# Patient Record
Sex: Female | Born: 1980 | Race: Black or African American | Hispanic: No | Marital: Single | Smoking: Never smoker
Health system: Southern US, Community
[De-identification: ages and names within clinical notes are randomized; demographics above are authoritative.]

## PROBLEM LIST (undated history)

## (undated) DIAGNOSIS — Z1379 Encounter for other screening for genetic and chromosomal anomalies: Secondary | ICD-10-CM

## (undated) DIAGNOSIS — K219 Gastro-esophageal reflux disease without esophagitis: Secondary | ICD-10-CM

## (undated) DIAGNOSIS — R519 Headache, unspecified: Secondary | ICD-10-CM

## (undated) DIAGNOSIS — N63 Unspecified lump in unspecified breast: Secondary | ICD-10-CM

## (undated) DIAGNOSIS — R51 Headache: Secondary | ICD-10-CM

## (undated) DIAGNOSIS — T7840XA Allergy, unspecified, initial encounter: Secondary | ICD-10-CM

## (undated) DIAGNOSIS — C50912 Malignant neoplasm of unspecified site of left female breast: Secondary | ICD-10-CM

## (undated) HISTORY — DX: Encounter for other screening for genetic and chromosomal anomalies: Z13.79

## (undated) HISTORY — PX: HYMENPLASTY: SHX1771

## (undated) HISTORY — DX: Allergy, unspecified, initial encounter: T78.40XA

## (undated) HISTORY — PX: HIP SURGERY: SHX245

## (undated) HISTORY — PX: BONY PELVIS SURGERY: SHX572

## (undated) HISTORY — PX: BREAST LUMPECTOMY: SHX2

## (undated) HISTORY — DX: Gastro-esophageal reflux disease without esophagitis: K21.9

---

## 2002-11-25 HISTORY — PX: WISDOM TOOTH EXTRACTION: SHX21

## 2012-04-10 DIAGNOSIS — M5430 Sciatica, unspecified side: Secondary | ICD-10-CM | POA: Insufficient documentation

## 2014-02-05 ENCOUNTER — Emergency Department: Payer: Self-pay | Admitting: Emergency Medicine

## 2014-02-05 LAB — CBC
HCT: 36.3 % (ref 35.0–47.0)
HGB: 12.3 g/dL (ref 12.0–16.0)
MCH: 29.7 pg (ref 26.0–34.0)
MCHC: 33.9 g/dL (ref 32.0–36.0)
MCV: 88 fL (ref 80–100)
PLATELETS: 252 10*3/uL (ref 150–440)
RBC: 4.14 10*6/uL (ref 3.80–5.20)
RDW: 15.1 % — AB (ref 11.5–14.5)
WBC: 7 10*3/uL (ref 3.6–11.0)

## 2014-02-05 LAB — COMPREHENSIVE METABOLIC PANEL
ALK PHOS: 49 U/L
Albumin: 3.6 g/dL (ref 3.4–5.0)
Anion Gap: 4 — ABNORMAL LOW (ref 7–16)
BILIRUBIN TOTAL: 0.4 mg/dL (ref 0.2–1.0)
BUN: 8 mg/dL (ref 7–18)
CALCIUM: 8.7 mg/dL (ref 8.5–10.1)
Chloride: 106 mmol/L (ref 98–107)
Co2: 27 mmol/L (ref 21–32)
Creatinine: 0.8 mg/dL (ref 0.60–1.30)
EGFR (Non-African Amer.): >60
Glucose: 66 mg/dL (ref 65–99)
OSMOLALITY: 270 (ref 275–301)
Potassium: 3.7 mmol/L (ref 3.5–5.1)
SGOT(AST): 16 U/L (ref 15–37)
SGPT (ALT): 11 U/L — ABNORMAL LOW (ref 12–78)
Sodium: 137 mmol/L (ref 136–145)
Total Protein: 7.7 g/dL (ref 6.4–8.2)

## 2014-02-05 LAB — HCG, QUANTITATIVE, PREGNANCY: Beta Hcg, Quant.: 25526 m[IU]/mL — ABNORMAL HIGH

## 2015-02-08 ENCOUNTER — Emergency Department: Payer: Self-pay | Admitting: Emergency Medicine

## 2015-11-29 ENCOUNTER — Emergency Department
Admission: EM | Admit: 2015-11-29 | Discharge: 2015-11-29 | Disposition: A | Discharge status: Home / Self Care | Payer: BLUE CROSS/BLUE SHIELD | Attending: Emergency Medicine | Admitting: Emergency Medicine

## 2015-11-29 ENCOUNTER — Encounter: Payer: Self-pay | Admitting: Emergency Medicine

## 2015-11-29 DIAGNOSIS — J029 Acute pharyngitis, unspecified: Secondary | ICD-10-CM | POA: Diagnosis present

## 2015-11-29 DIAGNOSIS — R112 Nausea with vomiting, unspecified: Secondary | ICD-10-CM | POA: Diagnosis not present

## 2015-11-29 DIAGNOSIS — J020 Streptococcal pharyngitis: Secondary | ICD-10-CM

## 2015-11-29 MED ORDER — LIDOCAINE VISCOUS 2 % MT SOLN
15.0000 mL | Freq: Once | OROMUCOSAL | Status: AC
  Administered 2015-11-29: 15 mL via OROMUCOSAL
  Filled 2015-11-29: qty 15

## 2015-11-29 MED ORDER — DEXAMETHASONE SODIUM PHOSPHATE 10 MG/ML IJ SOLN
10.0000 mg | Freq: Once | INTRAMUSCULAR | Status: AC
  Administered 2015-11-29: 10 mg via INTRAMUSCULAR
  Filled 2015-11-29: qty 1

## 2015-11-29 MED ORDER — PREDNISONE 10 MG PO TABS: 10.0000 mg | ORAL_TABLET | Freq: Two times a day (BID) | ORAL | Status: DC

## 2015-11-29 MED ORDER — PENICILLIN G BENZATHINE 1200000 UNIT/2ML IM SUSP
1.2000 10*6.[IU] | Freq: Once | INTRAMUSCULAR | Status: AC
  Administered 2015-11-29: 1.2 10*6.[IU] via INTRAMUSCULAR
  Filled 2015-11-29: qty 2

## 2015-11-29 NOTE — ED Provider Notes (Signed)
[facility] Emergency Department Provider Note ____________________________________________  Time seen: 0815  I have reviewed the triage vital signs and the nursing notes.  HISTORY  Chief Complaint  Fever and Sore Throat  HPI Kim Barker is a 35 y.o. female presents to the ED for evaluation of continued and worsening sore throat pain.She was evaluated on Sunday, 3 days prior to arrival, for sore throat symptoms at a local urgent care. She was confirmed with Strep via a rapid test. She was provided with 2 IM injections, which she is unclear what they were. She was also discharged with a prescription for Amoxil 500 mg with directions to dose 2 tabs once daily for 10 days. Reports today with increased pain, swelling, difficulty swallowing primary to the right side of the throat. She is also noted some nausea and vomited once today. She rates her overall discomfort at 9/10 in triage.  History reviewed. No pertinent past medical history.  There are no active problems to display for this patient.  History reviewed. No pertinent past surgical history.  Current Outpatient Rx  Name  Route  Sig  Dispense  Refill  . predniSONE (DELTASONE) 10 MG tablet   Oral   Take 1 tablet (10 mg total) by mouth 2 (two) times daily with a meal.   10 tablet   0    Allergies Review of patient's allergies indicates no known allergies.  No family history on file.  Social History Social History  Substance Use Topics  . Smoking status: Never Smoker   . Smokeless tobacco: None  . Alcohol Use: Yes    Review of Systems  Constitutional: Negative for fever. Eyes: Negative for visual changes. ENT: Positive for sore throat. Cardiovascular: Negative for chest pain. Respiratory: Negative for shortness of breath. Gastrointestinal: Negative for abdominal pain, vomiting and diarrhea. Genitourinary: Negative for dysuria. Musculoskeletal: Negative for back pain. Skin: Negative for  rash. Neurological: Negative for headaches, focal weakness or numbness. ____________________________________________  PHYSICAL EXAM:  VITAL SIGNS: ED Triage Vitals  Enc Vitals Group     BP 11/29/15 0802 135/90 mmHg     Pulse Rate 11/29/15 0802 106     Resp 11/29/15 0802 20     Temp 11/29/15 0802 99.2 F (37.3 C)     Temp Source 11/29/15 0802 Oral     SpO2 11/29/15 0802 99 %     Weight 11/29/15 0802 190 lb (86.183 kg)     Height 11/29/15 0802 5\' 4"  (1.626 m)     Head Cir --      Peak Flow --      Pain Score 11/29/15 0802 9     Pain Loc --      Pain Edu? --      Excl. in [place]? --    Constitutional: Alert and oriented. Well appearing and in no distress. Head: Normocephalic and atraumatic.      Eyes: Conjunctivae are normal. PERRL. Normal extraocular movements      Ears: Canals clear. TMs intact bilaterally.   Nose: No congestion/rhinorrhea.   Mouth/Throat: Mucous membranes are moist. Uvula is midline and oropharynx is severely injected. She is noted to have tonsillar fullness and swelling right greater than left., Exudate is noted bilaterally.   Neck: Supple. No thyromegaly. Hematological/Lymphatic/Immunological: Palpable right anterior cervical lymphadenopathy. Cardiovascular: Normal rate, regular rhythm.  Respiratory: Normal respiratory effort. No wheezes/rales/rhonchi. Gastrointestinal: Soft and nontender. No distention. Musculoskeletal: Nontender with normal range of motion in all extremities.  Neurologic:  Normal gait without  ataxia. Normal speech and language. No gross focal neurologic deficits are appreciated. Skin:  Skin is warm, dry and intact. No rash noted. Psychiatric: Mood and affect are normal. Patient exhibits appropriate insight and judgment. ____________________________________________   LABS (pertinent positives/negatives) Labs Reviewed  CULTURE, GROUP A STREP ([facility] ONLY)  ____________________________________________  PROCEDURES  Penicillin G 1.2  million units Decadron 10 mg PO Viscous lido 2% gargle ____________________________________________  INITIAL IMPRESSION / ASSESSMENT AND PLAN / ED COURSE  Patient with strep pharyngitis which is worsening secondary to underdosing with the appropriate antibiotic. Patient will be treated empirically with pen G injection, and discharged with Decadron dose for tonsillar swelling. She is encouraged to monitor symptoms and return to the ED immediately for worsening pain, swelling, difficulty swallowing. ____________________________________________  FINAL CLINICAL IMPRESSION(S) / ED DIAGNOSES  Final diagnoses:  Strep pharyngitis      Melvenia Needles, PA-C 11/29/15 [id number]  Harvest Dark, MD 11/29/15 1513

## 2015-11-29 NOTE — Discharge Instructions (Signed)
Strep Throat Strep throat is a bacterial infection of the throat. Your health care provider may call the infection tonsillitis or pharyngitis, depending on whether there is swelling in the tonsils or at the back of the throat. Strep throat is most common during the cold months of the year in children who are 39-33 years of age, but it can happen during any season in people of any age. This infection is spread from person to person (contagious) through coughing, sneezing, or close contact. CAUSES Strep throat is caused by the bacteria called Streptococcus pyogenes. RISK FACTORS This condition is more likely to develop in:  People who spend time in crowded places where the infection can spread easily.  People who have close contact with someone who has strep throat. SYMPTOMS Symptoms of this condition include:  Fever or chills.   Redness, swelling, or pain in the tonsils or throat.  Pain or difficulty when swallowing.  White or yellow spots on the tonsils or throat.  Swollen, tender glands in the neck or under the jaw.  Red rash all over the body (rare). DIAGNOSIS This condition is diagnosed by performing a rapid strep test or by taking a swab of your throat (throat culture test). Results from a rapid strep test are usually ready in a few minutes, but throat culture test results are available after one or two days. TREATMENT This condition is treated with antibiotic medicine. HOME CARE INSTRUCTIONS Medicines  Take over-the-counter and prescription medicines only as told by your health care provider.  Take your antibiotic as told by your health care provider. Do not stop taking the antibiotic even if you start to feel better.  Have family members who also have a sore throat or fever tested for strep throat. They may need antibiotics if they have the strep infection. Eating and Drinking  Do not share food, drinking cups, or personal items that could cause the infection to spread to  other people.  If swallowing is difficult, try eating soft foods until your sore throat feels better.  Drink enough fluid to keep your urine clear or pale yellow. General Instructions  Gargle with a salt-water mixture 3-4 times per day or as needed. To make a salt-water mixture, completely dissolve -1 tsp of salt in 1 cup of warm water.  Make sure that all household members wash their hands well.  Get plenty of rest.  Stay home from school or work until you have been taking antibiotics for 24 hours.  Keep all follow-up visits as told by your health care provider. This is important. SEEK MEDICAL CARE IF:  The glands in your neck continue to get bigger.  You develop a rash, cough, or earache.  You cough up a thick liquid that is green, yellow-brown, or bloody.  You have pain or discomfort that does not get better with medicine.  Your problems seem to be getting worse rather than better.  You have a fever. SEEK IMMEDIATE MEDICAL CARE IF:  You have new symptoms, such as vomiting, severe headache, stiff or painful neck, chest pain, or shortness of breath.  You have severe throat pain, drooling, or changes in your voice.  You have swelling of the neck, or the skin on the neck becomes red and tender.  You have signs of dehydration, such as fatigue, dry mouth, and decreased urination.  You become increasingly sleepy, or you cannot wake up completely.  Your joints become red or painful.   This information is not intended to replace  advice given to you by your health care provider. Make sure you discuss any questions you have with your health care provider.   Document Released: 11/08/2000 Document Revised: 08/02/2015 Document Reviewed: 03/06/2015 Elsevier Interactive Patient Education 2016 [place] the prescription steroid as directed. Continue Tylenol as needed. Rinse with warm-salty water daily.  Consider mixing equal parts of Benadryl elixir + Maalox/Mylanta and  gargling as needed. Return to the ED for worsening symptoms or difficulty swallowing.

## 2015-11-29 NOTE — ED Notes (Addendum)
Pt reports she was diagnosed on Sunday at Urgent Care with strep throat and was given 2 shots. Pt states she is not feeling any better. Pt states she has been taking Ibuprofen and Tylenol as well. Prescribed Amoxicillin Sunday 2 pills daily. Did not take today due to vomiting.

## 2015-11-29 NOTE — ED Notes (Signed)
Pt dx with strep on Sunday was started on amoxicillin, pt states she  Not any better.

## 2015-12-01 LAB — CULTURE, GROUP A STREP (THRC)

## 2016-03-21 ENCOUNTER — Encounter: Payer: Self-pay | Admitting: General Surgery

## 2016-03-21 ENCOUNTER — Ambulatory Visit (INDEPENDENT_AMBULATORY_CARE_PROVIDER_SITE_OTHER): Payer: BLUE CROSS/BLUE SHIELD | Admitting: General Surgery

## 2016-03-21 VITALS — BP 129/82 | HR 87 | Temp 98.7°F | Ht 64.0 in | Wt 193.8 lb

## 2016-03-21 DIAGNOSIS — K429 Umbilical hernia without obstruction or gangrene: Secondary | ICD-10-CM | POA: Diagnosis not present

## 2016-03-21 NOTE — Progress Notes (Signed)
Patient ID: Kim Barker, female   DOB: Apr 11, 1981, 35 y.o.   MRN: [id number]  CC: ABDOMINAL PAIN  HPI Kim Barker is a 35 y.o. female presents to clinic for evaluation of a bulge and pain to her anterior abdominal wall. Patient reports that she has known about an umbilical hernia for at least the last 12 years and that every time she is pregnant the area of her umbilicus bulges out. She is also noticed over the last several months an area of bulging and tenderness superior to the umbilicus that bothers her about once a week when she does have a lifting or bends over to lift something. She's never had bulges take out from either site that had not been able to reduce. She's never had to manually reduce either site. She denies any fevers, chills, nausea, vomiting, diarrhea, constipation, chest pain, shortness of breath. She also states that she is thinking about having liposuction and wants to make sure that neither of these areas would preclude her from liposuction. She is otherwise in her usual state of excellent health.  HPI  Past Medical History  Diagnosis Date  . GERD (gastroesophageal reflux disease)   . Allergy     Past Surgical History  Procedure Laterality Date  . Hymenplasty  35 years old  . Wisdom tooth extraction  2004  . Breast lumpectomy Left 35 years old    Benign    Family History  Problem Relation Age of Onset  . Diabetes Mother   . Asthma Father   . Diabetes Father   . Asthma Brother   . Diabetes Maternal Grandmother   . COPD Paternal Grandfather   . Heart disease Paternal Grandfather     Social History Social History  Substance Use Topics  . Smoking status: Never Smoker   . Smokeless tobacco: Never Used  . Alcohol Use: Yes    No Known Allergies  Current Outpatient Prescriptions  Medication Sig Dispense Refill  . acetaminophen (TYLENOL) 325 MG tablet Take 650 mg by mouth every 6 (six) hours as needed.    Marland Kitchen ibuprofen (ADVIL,MOTRIN) 200 MG tablet Take 400  mg by mouth every 6 (six) hours as needed.     No current facility-administered medications for this visit.     Review of Systems A Multi-point review of systems was asked and was negative except for the findings documented in the history of present illness  Physical Exam Blood pressure 129/82, pulse 87, temperature 98.7 F (37.1 C), temperature source Oral, height 5\' 4"  (1.626 m), weight 87.907 kg (193 lb 12.8 oz), last menstrual period 03/03/2016. CONSTITUTIONAL: No acute distress. EYES: Pupils are equal, round, and reactive to light, Sclera are non-icteric. EARS, NOSE, MOUTH AND THROAT: The oropharynx is clear. The oral mucosa is pink and moist. Hearing is intact to voice. LYMPH NODES:  Lymph nodes in the neck are normal. RESPIRATORY:  Lungs are clear. There is normal respiratory effort, with equal breath sounds bilaterally, and without pathologic use of accessory muscles. CARDIOVASCULAR: Heart is regular without murmurs, gallops, or rubs. GI: The abdomen is soft, and nondistended. There is a visible umbilical hernia. It is easily reducible and nontender. The area cephalad to this has a defect that may represent a diastases recti versus ventral hernia. It is mildly tender to deep palpation just to the right of midline. There is no obvious hernia sac on examination.. There is no hepatosplenomegaly. There are normal bowel sounds in all quadrants. GU: Rectal deferred.   MUSCULOSKELETAL: Normal muscle  strength and tone. No cyanosis or edema.   SKIN: Turgor is good and there are no pathologic skin lesions or ulcers. NEUROLOGIC: Motor and sensation is grossly normal. Cranial nerves are grossly intact. PSYCH:  Oriented to person, place and time. Affect is normal.  Data Reviewed There are no labs or images to be reviewed for this appointment I have personally reviewed the patient's imaging, laboratory findings and medical records.    Assessment    35 year old female with an umbilical hernia  and possible ventral hernia    Plan    Had a long conversation with the patient about the diagnosis of umbilical hernia and possible ventral hernia. Also discussed that this may simply reflect a diastases recti. The differential between hernia and diastases were discussed at length as well as the possible treatment options. Discussed that I did not think that either these problems would prevent her from having a lipoma suction. However stated that if she does have ventral hernias they will not improve 5 liposuction and would eventually require surgical correction. At present, she states they do not bother her enough to want to pursue a potential painful surgery. Provided her with our contact information and the understanding that should she change her mind and become ready for surgery that she can call the clinic and cross-sectional imaging would be obtained to delineate whether not this is a diastases or a ventral hernia. The signs and symptoms of incarceration strength motion were discussed in detail patient voiced understanding. She will follow up emergently should either that occur. Otherwise, she'll follow-up in clinic on an as-needed basis.     Time spent with the patient was 30 minutes, with more than 50% of the time spent in face-to-face education, counseling and care coordination.     Clayburn Pert, MD FACS General Surgeon 03/21/2016, 10:35 AM

## 2016-03-21 NOTE — Patient Instructions (Signed)
Call our office or go to the Emergency Room if you develop any of the symptoms below causing emergency concern.  If this becomes more of a problem for you and you would like to have surgery done, please call our office. We will get an Ultrasound first and then have you come back to see Dr. Adonis Huguenin after the Ultrasound.    Hernia, Adult A hernia is the bulging of an organ or tissue through a weak spot in the muscles of the abdomen (abdominal wall). Hernias develop most often near the navel or groin. There are many kinds of hernias. Common kinds include:  Femoral hernia. This kind of hernia develops under the groin in the upper thigh area.  Inguinal hernia. This kind of hernia develops in the groin or scrotum.  Umbilical hernia. This kind of hernia develops near the navel.  Hiatal hernia. This kind of hernia causes part of the stomach to be pushed up into the chest.  Incisional hernia. This kind of hernia bulges through a scar from an abdominal surgery. CAUSES This condition may be caused by:  Heavy lifting.  Coughing over a long period of time.  Straining to have a bowel movement.  An incision made during an abdominal surgery.  A birth defect (congenital defect).  Excess weight or obesity.  Smoking.  Poor nutrition.  Cystic fibrosis.  Excess fluid in the abdomen.  Undescended testicles. SYMPTOMS Symptoms of a hernia include:  A lump on the abdomen. This is the first sign of a hernia. The lump may become more obvious with standing, straining, or coughing. It may get bigger over time if it is not treated or if the condition causing it is not treated.  Pain. A hernia is usually painless, but it may become painful over time if treatment is delayed. The pain is usually dull and may get worse with standing or lifting heavy objects. Sometimes a hernia gets tightly squeezed in the weak spot (strangulated) or stuck there (incarcerated) and causes additional symptoms. These  symptoms may include:  Vomiting.  Nausea.  Constipation.  Irritability. DIAGNOSIS A hernia may be diagnosed with:  A physical exam. During the exam your health care provider may ask you to cough or to make a specific movement, because a hernia is usually more visible when you move.  Imaging tests. These can include:  X-rays.  Ultrasound.  CT scan. TREATMENT A hernia that is small and painless may not need to be treated. A hernia that is large or painful may be treated with surgery. Inguinal hernias may be treated with surgery to prevent incarceration or strangulation. Strangulated hernias are always treated with surgery, because lack of blood to the trapped organ or tissue can cause it to die. Surgery to treat a hernia involves pushing the bulge back into place and repairing the weak part of the abdomen. HOME CARE INSTRUCTIONS  Avoid straining.  Do not lift anything heavier than 10 lb (4.5 kg).  Lift with your leg muscles, not your back muscles. This helps avoid strain.  When coughing, try to cough gently.  Prevent constipation. Constipation leads to straining with bowel movements, which can make a hernia worse or cause a hernia repair to break down. You can prevent constipation by:  Eating a high-fiber diet that includes plenty of fruits and vegetables.  Drinking enough fluids to keep your urine clear or pale yellow. Aim to drink 6-8 glasses of water per day.  Using a stool softener as directed by your health care  provider.  Lose weight, if you are overweight.  Do not use any tobacco products, including cigarettes, chewing tobacco, or electronic cigarettes. If you need help quitting, ask your health care provider.  Keep all follow-up visits as directed by your health care provider. This is important. Your health care provider may need to monitor your condition. SEEK MEDICAL CARE IF:  You have swelling, redness, and pain in the affected area.  Your bowel habits  change. SEEK IMMEDIATE MEDICAL CARE IF:  You have a fever.  You have abdominal pain that is getting worse.  You feel nauseous or you vomit.  You cannot push the hernia back in place by gently pressing on it while you are lying down.  The hernia:  Changes in shape or size.  Is stuck outside the abdomen.  Becomes discolored.  Feels hard or tender.   This information is not intended to replace advice given to you by your health care provider. Make sure you discuss any questions you have with your health care provider.   Document Released: 11/11/2005 Document Revised: 12/02/2014 Document Reviewed: 09/21/2014 Elsevier Interactive Patient Education [employer].

## 2016-11-17 ENCOUNTER — Emergency Department
Admission: EM | Admit: 2016-11-17 | Discharge: 2016-11-17 | Disposition: A | Discharge status: Home / Self Care | Payer: BLUE CROSS/BLUE SHIELD | Attending: Emergency Medicine | Admitting: Emergency Medicine

## 2016-11-17 ENCOUNTER — Emergency Department: Payer: BLUE CROSS/BLUE SHIELD

## 2016-11-17 ENCOUNTER — Encounter: Payer: Self-pay | Admitting: Emergency Medicine

## 2016-11-17 DIAGNOSIS — M79604 Pain in right leg: Secondary | ICD-10-CM

## 2016-11-17 DIAGNOSIS — M79661 Pain in right lower leg: Secondary | ICD-10-CM | POA: Insufficient documentation

## 2016-11-17 MED ORDER — GABAPENTIN 100 MG PO CAPS: 100.0000 mg | ORAL_CAPSULE | Freq: Three times a day (TID) | ORAL | 0 refills | Status: DC

## 2016-11-17 NOTE — ED Triage Notes (Signed)
States pain that goes down the leg x 2 weeks - numbness in foot since last night. Able to ambulate with steady gait

## 2016-11-17 NOTE — ED Provider Notes (Signed)
[facility] Emergency Department Provider Note _____________________________________   I have reviewed the triage vital signs and the nursing notes.   HISTORY  Chief Complaint Leg Pain   History limited by: Not Limited   HPI Kim Barker is a 35 y.o. female who presents to the emergency department today because of concern for possible blood clot. The patient states she has been having right leg pain for the past 2 weeks. This located in the back of her leg. Extends from her knee down to her feet. The patient states that she did not have any trauma to her leg. She denies any unusual use of her leg. She came in today because she started feeling some numbness in her feet. Denies similar symptoms in the past. Is concerned for blood clot because her family member was recently diagnosed with a DVT.   Past Medical History:  Diagnosis Date  . Allergy   . GERD (gastroesophageal reflux disease)     Patient Active Problem List   Diagnosis Date Noted  . Umbilical hernia without obstruction or gangrene 03/21/2016  . 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 BREAST LUMPECTOMY Left 35 years old   Benign  . HYMENPLASTY  35 years old  . [place] EXTRACTION  2004    Prior to Admission medications   Medication Sig Start Date End Date Taking? Authorizing Provider  acetaminophen (TYLENOL) 325 MG tablet Take 650 mg by mouth every 6 (six) hours as needed.    Historical Provider, MD  ibuprofen (ADVIL,MOTRIN) 200 MG tablet Take 400 mg by mouth every 6 (six) hours as needed.    Historical Provider, MD    Allergies Patient has no known allergies.  Family History  Problem Relation Age of Onset  . Diabetes Mother   . Asthma Father   . Diabetes Father   . Asthma Brother   . Diabetes Maternal Grandmother   . COPD Paternal Grandfather   . Heart disease Paternal Grandfather     Social History Social  History  Substance Use Topics  . Smoking status: Never Smoker  . Smokeless tobacco: Never Used  . Alcohol use Yes    Review of Systems  Constitutional: Negative for fever. Cardiovascular: Negative for chest pain. Respiratory: Negative for shortness of breath. Gastrointestinal: Negative for abdominal pain, vomiting and diarrhea. Genitourinary: Negative for dysuria. Musculoskeletal: Positive for right lower leg pain. Skin: Negative for rash. Neurological: Negative for headaches, focal weakness or numbness.  10-point ROS otherwise negative.  ____________________________________________   PHYSICAL EXAM:  VITAL SIGNS: ED Triage Vitals  Enc Vitals Group     BP 11/17/16 1804 139/76     Pulse Rate 11/17/16 1804 92     Resp 11/17/16 1804 18     Temp 11/17/16 1804 98.2 F (36.8 C)     Temp src --      SpO2 11/17/16 1804 100 %     Weight 11/17/16 1804 184 lb (83.5 kg)     Height 11/17/16 1804 5\' 4"  (1.626 m)     Head Circumference --      Peak Flow --      Pain Score 11/17/16 1805 6    Constitutional: Alert and oriented. Well appearing and in no distress. Eyes: Conjunctivae are normal. Normal extraocular movements. ENT   Head: Normocephalic and atraumatic.   Nose: No congestion/rhinnorhea.   Mouth/Throat: Mucous membranes are moist.   Neck: No stridor. Hematological/Lymphatic/Immunilogical: No cervical  lymphadenopathy. Cardiovascular: Normal rate, regular rhythm.  No murmurs, rubs, or gallops.  Respiratory: Normal respiratory effort without tachypnea nor retractions. Breath sounds are clear and equal bilaterally. No wheezes/rales/rhonchi. Musculoskeletal: Normal range of motion in all extremities. No lower extremity edema. No erythema or warmth to right lower leg. Compartments soft. DP 2+ Neurologic:  Normal speech and language. No gross focal neurologic deficits are appreciated.  Skin:  Skin is warm, dry and intact. No rash noted. Psychiatric: Mood and affect  are normal. Speech and behavior are normal. Patient exhibits appropriate insight and judgment.  ____________________________________________    LABS (pertinent positives/negatives)  Labs Reviewed - No data to display   ____________________________________________   EKG  None  ____________________________________________    RADIOLOGY  RLE US IMPRESSION: No evidence of deep venous thrombosis.  ____________________________________________   PROCEDURES  Procedures  ____________________________________________   INITIAL IMPRESSION / ASSESSMENT AND PLAN / ED COURSE  Pertinent labs & imaging results that were available during my care of the patient were reviewed by me and considered in my medical decision making (see chart for details).  Patient presented to the emergency department today with concerns for right lower leg pain and possible blood,. Ultrasound was negative for DVT. Exam no concerning findings. Patient does have a history of sciatica. Will try gabapentin for the patient for her pain.  ____________________________________________   FINAL CLINICAL IMPRESSION(S) / ED DIAGNOSES  Final diagnoses:  Right leg pain     Note: This dictation was prepared with Dragon dictation. Any transcriptional errors that result from this process are unintentional     Nance Pear, MD 11/17/16 2148

## 2016-11-17 NOTE — Discharge Instructions (Signed)
Please seek medical attention for any high fevers, chest pain, shortness of breath, change in behavior, persistent vomiting, bloody stool or any other new or concerning symptoms.  

## 2016-11-17 NOTE — ED Triage Notes (Signed)
Pt still in u/s.  

## 2016-11-17 NOTE — ED Notes (Signed)

## 2016-11-25 DIAGNOSIS — C50912 Malignant neoplasm of unspecified site of left female breast: Secondary | ICD-10-CM

## 2016-11-25 DIAGNOSIS — Z1379 Encounter for other screening for genetic and chromosomal anomalies: Secondary | ICD-10-CM

## 2016-11-25 HISTORY — DX: Encounter for other screening for genetic and chromosomal anomalies: Z13.79

## 2016-11-25 HISTORY — DX: Malignant neoplasm of unspecified site of left female breast: C50.912

## 2017-01-02 ENCOUNTER — Other Ambulatory Visit: Payer: Self-pay | Admitting: Family Medicine

## 2017-01-02 DIAGNOSIS — N6322 Unspecified lump in the left breast, upper inner quadrant: Secondary | ICD-10-CM

## 2017-01-14 ENCOUNTER — Ambulatory Visit
Admission: RE | Admit: 2017-01-14 | Discharge: 2017-01-14 | Disposition: A | Discharge status: Home / Self Care | Payer: BLUE CROSS/BLUE SHIELD | Source: Ambulatory Visit | Attending: Family Medicine | Admitting: Family Medicine

## 2017-01-14 DIAGNOSIS — Z9889 Other specified postprocedural states: Secondary | ICD-10-CM | POA: Diagnosis present

## 2017-01-14 DIAGNOSIS — N6322 Unspecified lump in the left breast, upper inner quadrant: Secondary | ICD-10-CM | POA: Insufficient documentation

## 2017-01-14 DIAGNOSIS — Z803 Family history of malignant neoplasm of breast: Secondary | ICD-10-CM | POA: Insufficient documentation

## 2017-01-14 HISTORY — DX: Unspecified lump in unspecified breast: N63.0

## 2017-01-21 ENCOUNTER — Other Ambulatory Visit: Payer: Self-pay | Admitting: Family Medicine

## 2017-01-21 DIAGNOSIS — R928 Other abnormal and inconclusive findings on diagnostic imaging of breast: Secondary | ICD-10-CM

## 2017-01-21 DIAGNOSIS — N6320 Unspecified lump in the left breast, unspecified quadrant: Secondary | ICD-10-CM

## 2017-01-27 ENCOUNTER — Ambulatory Visit
Admission: RE | Admit: 2017-01-27 | Discharge: 2017-01-27 | Disposition: A | Discharge status: Home / Self Care | Payer: BLUE CROSS/BLUE SHIELD | Source: Ambulatory Visit | Attending: Family Medicine | Admitting: Family Medicine

## 2017-01-27 DIAGNOSIS — N6320 Unspecified lump in the left breast, unspecified quadrant: Secondary | ICD-10-CM

## 2017-01-27 DIAGNOSIS — R928 Other abnormal and inconclusive findings on diagnostic imaging of breast: Secondary | ICD-10-CM

## 2017-01-27 DIAGNOSIS — C50812 Malignant neoplasm of overlapping sites of left female breast: Secondary | ICD-10-CM | POA: Diagnosis not present

## 2017-01-30 LAB — SURGICAL PATHOLOGY

## 2017-02-09 DIAGNOSIS — C50912 Malignant neoplasm of unspecified site of left female breast: Secondary | ICD-10-CM | POA: Insufficient documentation

## 2017-02-09 NOTE — Progress Notes (Addendum)
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  INTERVAL HISTORY: Patient is a 36 year old female who noted a rapidly enlarging mass in her left breast. She is also noted to have several masses in her right breast smaller in size. Subsequent biopsy of her left breast revealed malignant phyllodes tumor. Patient reports she also had benign right breast surgery at the age of 59. Currently, she is anxious but otherwise feels well. She has no neurologic complaints. She denies any recent fevers or illnesses. She has a good appetite and denies weight loss. She has no chest pain or shortness of breath. She denies any nausea, vomiting, constipation, or diarrhea. She has no urinary complaints. Patient otherwise feels well and offers no further specific complaints.  REVIEW OF SYSTEMS:   Review of Systems  Constitutional: Negative.  Negative for fever, malaise/fatigue and weight loss.  Respiratory: Negative.  Negative for cough and shortness of breath.   Cardiovascular: Negative.  Negative for chest pain and leg swelling.  Gastrointestinal: Negative.  Negative for abdominal pain.  Genitourinary: Negative.   Musculoskeletal: Negative.   Neurological: Negative.  Negative for sensory change and weakness.  Psychiatric/Behavioral: The patient is nervous/anxious.     As per HPI. Otherwise, a complete review of systems is negative.  PAST MEDICAL HISTORY: Past Medical History:  Diagnosis Date  . Allergy   . Breast mass    left breast present x 2 months  . Cancer ([place])   . GERD (gastroesophageal reflux disease)    PT WAS ON ACID REDUCER BUT HAS NOT TAKEN IN MONTHS  .  Headache    MIGRAINES    PAST SURGICAL HISTORY: Past Surgical History:  Procedure Laterality Date  . BREAST LUMPECTOMY Left 36 years old   Benign  . HYMENPLASTY  36 years old  . [place]IS[place] TO[place] EXTRACTION  2004    FAMILY HISTORY: Family History  Problem Relation Age of Onset  . Diabetes Mother   . Asthma Father   . Diabetes Father   . Asthma Brother   . Diabetes Maternal Grandmother   . COPD Paternal Grandfather   . Heart disease Paternal Grandfather   . Breast cancer Maternal Aunt 46  . Breast cancer Maternal Aunt 43  . Breast cancer Maternal Aunt 40    ADVANCED DIRECTIVES (Y/N):  N  HEALTH MAINTENANCE: Social History  Substance Use Topics  . Smoking status: Never Smoker  . Smokeless tobacco: Never Used  . Alcohol use Yes     Comment: OCC     Colonoscopy:  PAP:  Bone density:  Lipid panel:  No Known Allergies  Current Outpatient Prescriptions  Medication Sig Dispense Refill  . ibuprofen (ADVIL,MOTRIN) 200 MG tablet Take 800 mg by mouth 2 (two) times daily as needed for headache or moderate pain.     . diphenhydramine-acetaminophen (TYLENOL PM) 25-500 MG TABS tablet Take 2 tablets by mouth at bedtime as needed (sleep/pain).    . traMADol (ULTRAM) 50 MG tablet Take 1 tablet (50 mg total) by mouth every 6 (six) hours as needed. (Patient taking differently: Take 50 mg by mouth at bedtime as needed for moderate pain. ) 30 tablet 0   No current facility-administered medications for this visit.  OBJECTIVE: Vitals:   02/10/17 1146  BP: 132/87  Pulse: 86  Resp: 18  Temp: 98.3 F (36.8 C)     Body mass index is 35.09 kg/m.    ECOG FS:0 - Asymptomatic  General: Well-developed, well-nourished, no acute distress. Eyes: Pink conjunctiva, anicteric sclera. HEENT: Normocephalic, moist mucous membranes, clear oropharnyx. Breasts: Large, easily palpable left breast mass. Right breast mass is also evident. Lungs: Clear to auscultation bilaterally. Heart: Regular  rate and rhythm. No rubs, murmurs, or gallops. Abdomen: Soft, nontender, nondistended. No organomegaly noted, normoactive bowel sounds. Musculoskeletal: No edema, cyanosis, or clubbing. Neuro: Alert, answering all questions appropriately. Cranial nerves grossly intact. Skin: No rashes or petechiae noted. Psych: Normal affect. Lymphatics: No cervical, calvicular, axillary or inguinal LAD.   LAB RESULTS:  Lab Results  Component Value Date   NA 137 02/05/2014   K 3.7 02/05/2014   CL 106 02/05/2014   CO2 27 02/05/2014   GLUCOSE 66 02/05/2014   BUN 8 02/05/2014   CREATININE 0.80 02/05/2014   CALCIUM 8.7 02/05/2014   PROT 7.7 02/05/2014   ALBUMIN 3.6 02/05/2014   AST 16 02/05/2014   ALT 11 (L) 02/05/2014   ALKPHOS 49 02/05/2014   BILITOT 0.4 02/05/2014   GFRNONAA >60 02/05/2014   GFRAA >60 02/05/2014    Lab Results  Component Value Date   WBC 7.0 02/05/2014   HGB 12.3 02/05/2014   HCT 36.3 02/05/2014   MCV 88 02/05/2014   PLT 252 02/05/2014     STUDIES: US Breast Complete Uni Left Inc Axilla  Result Date: 02/12/2017 Ultrasound examination confirmed a second solid mass in the lower inner quadrant of the left breast, 4:00 position, 3 cm from the nipple. This measurex 1.93 cm. A posterior acoustic enhancement was noted. Soft lobulation.  BI-RADS-3. The biopsy procedure was reviewed and she was amenable to proceed. Alcohol prep followed by 10 mL of 0.5% Xylocaine with 0.25% Marcaine with 1-200,000 of epinephrine. A small skin incision was made and an oblique approach was taken to the mass. Under ultrasound guidance a 14-gauge Bard spring-loaded device was used and 5 core samples were obtained. A postbiopsy clip was placed. No bleeding was noted. The skin defect was closed with benzoin followed by Steri-Strips, Telfa and Tegaderm dressing. Postbiopsy instructions were provided.   Mm Clip Placement Left  Result Date: 01/27/2017 CLINICAL DATA:  Status post ultrasound-guided core  biopsy of the left breast mass. EXAM: DIAGNOSTIC LEFT MAMMOGRAM POST ULTRASOUND BIOPSY COMPARISON:  Previous exam(s). FINDINGS: Mammographic images were obtained following ultrasound guided biopsy of a mass in the 10 o'clock region of the left breast. Mammographic images showed there is a wing shaped clip at the upper outer margin of the mass. It is in appropriate position. IMPRESSION: Status post ultrasound-guided core biopsy of a left breast mass with pathology pending. Final Assessment: Post Procedure Mammograms for Marker Placement Electronically Signed   By: Lillia Mountain M.D.   On: 01/27/2017 09:29   [place] Lt Breast Bx W Loc Dev 1st Lesion Img Bx Spec US Guide  Addendum Date: 02/03/2017   ADDENDUM REPORT: 02/03/2017 12:08 ADDENDUM: The patient has a strong family history of breast cancer in 3 premenopausal aunts. The patient's pathology was positive for high-grade sarcomatous malignancy with the differential including malignant phylloides tumor and metaplastic carcinoma. She has an additional mass in the 4 o'clock region of the left breast and 2 masses in the right breast at 6 o'clock 1 cm from the nipple and 4 cm from  the nipple. I spoke with Gennette Pac, FNP by phone on 02/03/2017. Ultrasound-guided core biopsies of the mass in the 4 o'clock region of the left breast and the 2 masses in the 6 o'clock region the right breast is recommended. She will contact the patient to schedule her for the biopsies. Electronically Signed   By: Lillia Mountain M.D.   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 Comment: The differential diagnosis includes malignant phyllodes tumor and metaplastic carcinoma. A super pancytokeratin stain was performed and the sarcomatous area in this material is negative,  favoring a malignant phyllodes tumor. Internal and external controls worked appropriately. These findings were communicated to [place] in Dr. Clarise Cruz office on 01/30/2017. Read back procedure was performed. There was a delay in the sign out of this case so that an immunohistochemical stain could be performed. This was found to be concordant with Dr. Clarise Cruz notes and impression. Recommendation:  Surgical and oncology referral. Results and recommendations were discussed with Gennette Pac, FNP by phone by Jetta Lout, [place] on 01/30/17. A copy of the report was faxed to her as they did not have a copy at the time of conversation. All of her Ms. Headrick's questions regarding the referral process were answered. She will give the patient results and then she will contact the nurse navigators at [facility] for assistance with making the referrals and contacting the patient as soon as possible. Attempts were made to contact the patient for a post biopsy check but were unsuccessful. Addendum by Jetta Lout, RRA on 01/31/18. Electronically Signed   By: Lillia Mountain M.D.   On: 01/31/2017 15:06   Result Date: 02/03/2017 CLINICAL DATA:  Left breast mass. EXAM: ULTRASOUND GUIDED LEFT BREAST CORE NEEDLE BIOPSY COMPARISON:  Previous exam(s). FINDINGS: I met with the patient and we discussed the procedure of ultrasound-guided biopsy, including benefits and alternatives. We discussed the high likelihood of a successful procedure. We discussed the risks of the procedure, including infection, bleeding, tissue injury, clip migration, and inadequate sampling. Informed written consent was given. The usual time-out protocol was performed immediately prior to the procedure. Using sterile technique and 1% Lidocaine as local anesthetic, under direct ultrasound visualization, a 12 gauge spring-loaded device was used to perform biopsy of a mass in the 10 o'clock region of the left breast using a lateral to medial approach.  At the conclusion of the procedure a wing shaped tissue marker clip was deployed into the biopsy cavity. Follow up 2 view mammogram was performed and dictated separately. IMPRESSION: Ultrasound guided biopsy of the left breast. No apparent complications. Electronically Signed: By: Lillia Mountain M.D. 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  PLAN:    1. 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 Although patient will likely require mastectomy to obtain clear surgical margins, she is interested in breast conservation and will further discuss with surgeon. We will determine if additional treatment is necessary once final pathology returns from her surgical intervention. Return to clinic one to 2 weeks after surgery for further evaluation. 2. Right breast mass: Patient will need biopsy to further evaluate. Ultimately, patient will likely require lumpectomy or mastectomy of her right breast as well.  3. Genetic testing: Given patient's young age as well as maternal aunt with breast cancer in her 73s genetic testing has been ordered and is pending at time of dictation.  Approximate 45 minutes was spent in discussion of which greater than 50% was consultation.  Patient expressed understanding and was in agreement with this plan. She also understands that She can call clinic at any time with any questions, concerns, or complaints.   Cancer Staging No matching staging information was found for the patient.  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 The clinical significance of this variant is currently uncertain.

## 2017-02-10 ENCOUNTER — Other Ambulatory Visit: Payer: Self-pay | Admitting: Family Medicine

## 2017-02-10 ENCOUNTER — Inpatient Hospital Stay: Payer: BLUE CROSS/BLUE SHIELD | Attending: Oncology | Admitting: Oncology

## 2017-02-10 ENCOUNTER — Encounter: Payer: Self-pay | Admitting: *Deleted

## 2017-02-10 ENCOUNTER — Encounter: Payer: Self-pay | Admitting: Oncology

## 2017-02-10 DIAGNOSIS — K219 Gastro-esophageal reflux disease without esophagitis: Secondary | ICD-10-CM | POA: Diagnosis not present

## 2017-02-10 DIAGNOSIS — N6310 Unspecified lump in the right breast, unspecified quadrant: Secondary | ICD-10-CM

## 2017-02-10 DIAGNOSIS — C50212 Malignant neoplasm of upper-inner quadrant of left female breast: Secondary | ICD-10-CM | POA: Diagnosis not present

## 2017-02-10 DIAGNOSIS — Z803 Family history of malignant neoplasm of breast: Secondary | ICD-10-CM | POA: Diagnosis not present

## 2017-02-10 DIAGNOSIS — N6320 Unspecified lump in the left breast, unspecified quadrant: Secondary | ICD-10-CM

## 2017-02-10 DIAGNOSIS — R928 Other abnormal and inconclusive findings on diagnostic imaging of breast: Secondary | ICD-10-CM

## 2017-02-10 DIAGNOSIS — C50912 Malignant neoplasm of unspecified site of left female breast: Secondary | ICD-10-CM

## 2017-02-10 DIAGNOSIS — Z79899 Other long term (current) drug therapy: Secondary | ICD-10-CM | POA: Insufficient documentation

## 2017-02-10 MED ORDER — TRAMADOL HCL 50 MG PO TABS: 50.0000 mg | ORAL_TABLET | Freq: Four times a day (QID) | ORAL | 0 refills | Status: DC | PRN

## 2017-02-10 NOTE — Progress Notes (Signed)
New evaluation for left breast mass. Complains of intermittent pain in left breast. Felt mass in breast appx 2 months ago.

## 2017-02-10 NOTE — Progress Notes (Signed)
  Oncology Nurse Navigator Documentation  Navigator Location: [facility] (02/10/17 1500) Referral date to RadOnc/MedOnc: 02/10/17 (02/10/17 1500) )Navigator Encounter Type: Initial MedOnc (02/10/17 1500)   Abnormal Finding Date: 01/14/17 (02/10/17 1500) Confirmed Diagnosis Date: 01/30/17 (02/10/17 1500)   Genetic Counseling Date: 02/10/17 (02/10/17 1500)           Patient Visit Type: MedOnc (02/10/17 1500) Treatment Phase: Pre-Tx/Tx Discussion (02/10/17 1500) Barriers/Navigation Needs: Education (02/10/17 1500) Education: Understanding Cancer/ Treatment Options;Coping with Diagnosis/ Prognosis;Newly Diagnosed Cancer Education (02/10/17 1500) Interventions: Referrals;Education (02/10/17 1500) Referrals: Other (02/10/17 1500)   Education Method: Verbal (02/10/17 1500)  Support Groups/Services: [employer] (02/10/17 1500)    Met patient and her mom today during her initial medical oncology consult with Dr. Grayland Ormond.  Patient newly diagnosed with probably malignant phyllodes tumor of the breast.  Dr. Grayland Ormond discussed need for surgery, possible excisional biopsy of the masses in the right breast, to defer delay of surgery for the left breast.  He discussed need for genetic testing based on patients age of diagnoses at 89, and her family history of cancer that includes: 2 maternal aunts with breast cancer in their 86's, a maternal great great aunt with breast cancer, maternal grandfather with throat cancer and a maternal great grandfather with throat cancer.  Blood specimen collected today for genetic testing and shipped to [facility] via [employer].  Hand-out on [employer] given to patient and navigator information.  She is to call if she has any questions or needs.         Time Spent with Patient: 90 (02/10/17 1500)

## 2017-02-11 ENCOUNTER — Encounter: Payer: Self-pay | Admitting: General Surgery

## 2017-02-11 ENCOUNTER — Ambulatory Visit (INDEPENDENT_AMBULATORY_CARE_PROVIDER_SITE_OTHER): Payer: BLUE CROSS/BLUE SHIELD | Admitting: General Surgery

## 2017-02-11 VITALS — BP 120/78 | HR 82 | Resp 12 | Ht 64.0 in | Wt 202.0 lb

## 2017-02-11 DIAGNOSIS — D4860 Neoplasm of uncertain behavior of unspecified breast: Secondary | ICD-10-CM | POA: Diagnosis not present

## 2017-02-11 NOTE — Progress Notes (Signed)
Patient ID: Kim Barker, female   DOB: 10-08-1981, 36 y.o.   MRN: [id number]  Chief Complaint  Patient presents with  . Mass    HPI Kim Barker is a 36 y.o. female here today to discuss a recently completed left breast biopsy. The patient has had All double breast lumps in the past, undergoing biopsy in 2010 at [facility]. About a year ago she felt some thickening in the upper inner quadrant of the left breast and was seen a local health clinic and reassured. About 2 months ago the area became more pronounced. Over the last month it significantly enlarged and became visible.   Patient had her biopsy done on 01/27/2017. Family history of breast cancer in 3 premenopausal aunts. 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 Mom, Kim Barker present at visit. HPI  Past Medical History:  Diagnosis Date  . Allergy   . Breast mass    left breast present x 2 months  . GERD (gastroesophageal reflux disease)     Past Surgical History:  Procedure Laterality Date  . BREAST LUMPECTOMY Left 36 years old   Benign  . HYMENPLASTY  36 years old  . WISDOM TOOTH EXTRACTION  2004    Family History  Problem Relation Age of Onset  . Diabetes Mother   . Asthma Father   . Diabetes Father   . Asthma Brother   . Diabetes Maternal Grandmother   . COPD Paternal Grandfather   . Heart disease Paternal Grandfather   . Breast cancer Maternal Aunt 79  . Breast cancer Maternal Aunt 13  . Breast cancer Maternal Aunt 13    Social History Social History  Substance Use Topics  . Smoking status: Never Smoker  . Smokeless tobacco: Never Used  . Alcohol use Yes    No Known Allergies  Current Outpatient Prescriptions  Medication Sig Dispense Refill  . acetaminophen (TYLENOL) 325 MG tablet Take 650 mg by mouth every 6 (six) hours as needed.    Marland Kitchen ibuprofen (ADVIL,MOTRIN) 200 MG tablet Take 400 mg by mouth every 6 (six) hours  as needed.    . traMADol (ULTRAM) 50 MG tablet Take 1 tablet (50 mg total) by mouth every 6 (six) hours as needed. 30 tablet 0   No current facility-administered medications for this visit.     Review of Systems Review of Systems  Blood pressure 120/78, pulse 82, resp. rate 12, height 5\' 4"  (1.626 m), weight 202 lb (91.6 kg), last menstrual period 02/06/2017.  Physical Exam Physical Exam  Constitutional: She is oriented to person, place, and time. She appears well-developed and well-nourished.  Eyes: Conjunctivae are normal. No scleral icterus.  Neck: Neck supple.  Cardiovascular: Normal rate, regular rhythm and normal heart sounds.   Pulmonary/Chest: Effort normal and breath sounds normal. Right breast exhibits mass (right breast  3 by 4 cm mass at 8 o'clock 3 cm from nipple ). Right breast exhibits no inverted nipple, no nipple discharge, no skin change and no tenderness. Left breast exhibits mass ( left breast 11 o'clock 5 by 5 cm  mass 4 cm from nipple). Left breast exhibits no inverted nipple, no nipple discharge, no skin change and no tenderness.    Abdominal: Soft. Bowel sounds are normal. There is no tenderness.  Lymphadenopathy:    She has no cervical adenopathy.  Neurological: She is alert and oriented to person, place, and time.  Skin: Skin is warm and dry.  Data Reviewed January 27, 2017 biopsy of the left breast upper inner quadrant: DIAGNOSIS:  A. BREAST, LEFT 10:00; ULTRASOUND GUIDED BIOPSY:  - HIGH GRADE SARCOMATOUS MALIGNANCY WITH ADJACENT SCLEROSED  FIBROADENOMATOUS LESION.   January 14, 2017 bilateral breast ultrasound: IMPRESSION: 1. There is a complex mass in the subareolar left breast at 10 o'clock corresponding with the growing mass identified by the patient. Ultrasound-guided biopsy is recommended.  2. There are multiple bilateral masses, most likely all representing benign fibroadenomas. Mammogram and ultrasound follow-up will be recommended for the  2 subareolar right breast masses and the left breast mass at 4 o'clock.  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    Plan    The patient is very much chest should breast conservation. She wears a " DD"  cup bra, and this may be possible.  It will be important to confirm that the additional solid mass identified in her recent ultrasound and the 4:00 position of the left breast is indeed only a fibroadenoma. We were this to be malignant breast conservation would likely be unsuccessful.  Considering the volume of the phyllodes lesion, it's likely her left breast size will be markedly diminished, likely to a "C" cup. The importance of obtaining clear margins was emphasized, and a frank discussion about the role of mastectomy if indicated was undertaken.  Breast symmetry surgery could be considered further treatment.  Both right breast lesions will ward biopsy to confirm a benign histology, and this will be undertaken when the  left breast tumor is removed.  Radiation therapy is typically recommended for those patients with malignant lesions. Metastatic disease to the axilla is exceptionally rare as these lesions follow the pattern of sarcomas rather than primary breast cancer.  At present, no indication for chemotherapy or hormone suppression therapy.     Patient to return tomorrow for left breast biopsy.  This information has been scribed by Gaspar Cola CMA.   Robert Bellow 02/12/2017, 9:04 PM

## 2017-02-11 NOTE — Patient Instructions (Signed)
Patient to return tomorrow for left breast biopsy.

## 2017-02-12 ENCOUNTER — Encounter: Payer: Self-pay | Admitting: General Surgery

## 2017-02-12 ENCOUNTER — Ambulatory Visit (INDEPENDENT_AMBULATORY_CARE_PROVIDER_SITE_OTHER): Payer: BLUE CROSS/BLUE SHIELD | Admitting: General Surgery

## 2017-02-12 ENCOUNTER — Inpatient Hospital Stay: Payer: Self-pay

## 2017-02-12 VITALS — BP 122/80 | HR 82 | Resp 13 | Ht 64.0 in | Wt 204.0 lb

## 2017-02-12 DIAGNOSIS — N6320 Unspecified lump in the left breast, unspecified quadrant: Secondary | ICD-10-CM

## 2017-02-12 DIAGNOSIS — D4860 Neoplasm of uncertain behavior of unspecified breast: Secondary | ICD-10-CM | POA: Diagnosis not present

## 2017-02-12 NOTE — Progress Notes (Signed)
Patient ID: Kim Barker, female   DOB: [birth year], 36 y.o.   MRN: [id number]  No chief complaint on file.   HPI Kim Barker is a 36 y.o. female here today for her left breast biopsy. The procedure is planned prior to wide excision of the suspected malignant phyllodes tumor in the upper inner quadrant/central left breast.  The patient was accompanied today by her boyfriend.  HPI  Past Medical History:  Diagnosis Date  . Allergy   . Breast mass    left breast present x 2 months  . GERD (gastroesophageal reflux disease)     Past Surgical History:  Procedure Laterality Date  . BREAST LUMPECTOMY Left 36 years old   Benign  . HYMENPLASTY  36 years old  . WISDOM TOOTH EXTRACTION  2004    Family History  Problem Relation Age of Onset  . Diabetes Mother   . Asthma Father   . Diabetes Father   . Asthma Brother   . Diabetes Maternal Grandmother   . COPD Paternal Grandfather   . Heart disease Paternal Grandfather   . Breast cancer Maternal Aunt 67  . Breast cancer Maternal Aunt 64  . Breast cancer Maternal Aunt 6    Social History Social History  Substance Use Topics  . Smoking status: Never Smoker  . Smokeless tobacco: Never Used  . Alcohol use Yes    No Known Allergies  Current Outpatient Prescriptions  Medication Sig Dispense Refill  . acetaminophen (TYLENOL) 325 MG tablet Take 650 mg by mouth every 6 (six) hours as needed.    Marland Kitchen ibuprofen (ADVIL,MOTRIN) 200 MG tablet Take 400 mg by mouth every 6 (six) hours as needed.    . traMADol (ULTRAM) 50 MG tablet Take 1 tablet (50 mg total) by mouth every 6 (six) hours as needed. 30 tablet 0   No current facility-administered medications for this visit.     Review of Systems Review of Systems  Constitutional: Negative.   Respiratory: Negative.   Cardiovascular: Negative.     Blood pressure 122/80, pulse 82, resp. rate 13, height 5\' 4"  (1.626 m), weight 204 lb (92.5 kg), last menstrual period 02/06/2017.  Physical  Exam Physical Exam  Data Reviewed Ultrasound examination confirmed a second solid mass in the lower inner quadrant of the left breast, 4:00 position, 3 cm from the nipple. This measurex 1.93 cm. A posterior acoustic enhancement was noted. Soft lobulation.  The biopsy procedure  Was reviewed and she was amenable to proceed.  Alcohol prep followed by 10 mL of 0.5% Xylocaine with 0.25% Marcaine with 1-200,000 of epinephrine. A small skin incision was made and an oblique approach was taken to the mass. Under ultrasound guidance a 14-gauge Bard spring-loaded device was used and 5 core samples were obtained. A postbiopsy clip was placed. No bleeding was noted. The skin defect was closed with benzoin followed by Steri-Strips, Telfa and Tegaderm dressing.  Postbiopsy instructions were provided.  Assessment     Malignant Enis Slipper tumor of the left breast, likely fibroadenomaIn the lower outer quadrant.    Plan    The patient will be contacted when biopsy results are available. Plans at present are for breast conservation surgery next week. She is aware that if this second lesion is malignant we'll need to reassess the advisability of that plan.  At the time of her upcoming surgery core biopsies will be obtained of the 2 solid nodules previously identified in the right breast to confirm the clinical impression of fibroadenoma.  Follow up appointment to be announced.  The patient is scheduled for surgery at [facility] on 02/20/17. She will pre admit by phone. She is aware of date and instructions.  This information has been scribed by Gaspar Cola CMA.    Robert Bellow 02/12/2017, 7:56 PM

## 2017-02-12 NOTE — Patient Instructions (Addendum)
CARE AFTER BREAST BIOPSY  1. Leave the dressing on that your doctor applied after the biopsy. It is waterproof. You may bathe, shower and/or swim. The dressing can be removed in 3 days, you will see small strips of tape against your skin on the incision. Do not remove these strips they will gradually fall off in about 2-3 weeks. You may use an ice pack on and off for the first 12-24 hours for comfort.  2. You may want to use a gauze,cloth or similar protection in your bra to prevent rubbing against your dressing and incision. This is not necessary, but you may feel more comfortable doing so.  3. It is recommended that you wear a bra day and night to give support to the breast. This will prevent the weight of the breast from pulling on the incision.  4. Your breast may feel hard and lumpy under the incision. Do not be alarmed. This is the underlying stitching of tissue. Softening of this tissue will occur in time.  5. You may have a follow up appointment or phone follow up in one week after your biopsy. The office phone number is [phone].  6. You will notice about a week or two after your office visit that the strips of the tape on your incision will begin to loosen. These may then be removed.  7. Report to your doctor any of the following:  * Severe pain not relieved by your pain medication  *Redness of the incision  * Drainage from the incision  *Fever greater than 101 degrees  The patient is scheduled for surgery at [facility] on 02/20/17. She will pre admit by phone. She is aware of date and instructions.

## 2017-02-13 ENCOUNTER — Telehealth: Payer: Self-pay | Admitting: General Surgery

## 2017-02-13 NOTE — Telephone Encounter (Signed)
Notified path consistent w/ fibroadenoma. 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

## 2017-02-14 ENCOUNTER — Encounter
Admission: RE | Admit: 2017-02-14 | Discharge: 2017-02-14 | Disposition: A | Discharge status: Home / Self Care | Payer: BLUE CROSS/BLUE SHIELD | Source: Ambulatory Visit | Attending: General Surgery | Admitting: General Surgery

## 2017-02-14 HISTORY — DX: Headache, unspecified: R51.9

## 2017-02-14 HISTORY — DX: Headache: R51

## 2017-02-14 NOTE — Patient Instructions (Addendum)
  Your procedure is scheduled on: 02-20-17 Report to Same Day Surgery 2nd floor medical mall [facility] Entrance-take elevator on left to 2nd floor.  Check in with surgery information desk.) To find out your arrival time please call [phone] between 1PM - 3PM on 02-19-17  Remember: Instructions that are not followed completely may result in serious medical risk, up to and including death, or upon the discretion of your surgeon and anesthesiologist your surgery may need to be rescheduled.    _x___ 1. Do not eat food or drink liquids after midnight. No gum chewing or hard candies.     __x__ 2. No Alcohol for 24 hours before or after surgery.   __x__3. No Smoking for 24 prior to surgery.   ____  4. Bring all medications with you on the day of surgery if instructed.    __x__ 5. Notify your doctor if there is any change in your medical condition     (cold, fever, infections).     Do not wear jewelry, make-up, hairpins, clips or nail polish.  Do not wear lotions, powders, or perfumes. You may wear deodorant.  Do not shave 48 hours prior to surgery. Men may shave face and neck.  Do not bring valuables to the hospital.    [facility] is not responsible for any belongings or valuables.               Contacts, dentures or bridgework may not be worn into surgery.  Leave your suitcase in the car. After surgery it may be brought to your room.  For patients admitted to the hospital, discharge time is determined by your treatment team.   Patients discharged the day of surgery will not be allowed to drive home.  You will need someone to drive you home and stay with you the night of your procedure.    Please read over the following fact sheets that you were given:   [facility] Preparing for Surgery and or MRSA Information   _x___ Take anti-hypertensive (unless it includes a diuretic), cardiac, seizure, asthma,     anti-reflux and psychiatric medicines. These include:  1.  NONE  2.  3.  4.  5.  6.  ____Fleets enema or Magnesium Citrate as directed.   ____ Use CHG Soap or sage wipes as directed on instruction sheet   ____ Use inhalers on the day of surgery and bring to hospital day of surgery  ____ Stop Metformin and Janumet 2 days prior to surgery.    ____ Take 1/2 of usual insulin dose the night before surgery and none on the morning     surgery.   ____ Follow recommendations from Cardiologist, Pulmonologist or PCP regarding stopping Aspirin, Coumadin, Pllavix ,Eliquis, Effient, or Pradaxa, and Pletal.  X____Stop Anti-inflammatories such as Advil, Aleve, Ibuprofen, Motrin, Naproxen, Naprosyn, Goodies powders or aspirin products NOW-OK to take Tylenol OR TRAMDOL    ____ Stop supplements until after surgery.    ____ Bring C-Pap to the hospital.

## 2017-02-18 ENCOUNTER — Telehealth: Payer: Self-pay

## 2017-02-18 NOTE — Telephone Encounter (Signed)
-----   Message from Lloyd Huger, MD sent at 02/17/2017  4:18 PM EDT ----- Regarding: RE: follow up Mid April would be perfect.  ----- Message ----- From: Luretha Murphy, CMA Sent: 02/17/2017  10:02 AM To: Lloyd Huger, MD Subject: follow up                                      When do you want patient to follow up? 1 or 2 weeks she surgery is 02/20/17 for partial mastectomy

## 2017-02-18 NOTE — Telephone Encounter (Signed)
Message sent to scheduling for follow up with MD after surgery

## 2017-02-19 NOTE — Pre-Procedure Instructions (Signed)
PT NEVER CALLED BACK TO GET SURGERY INSTRUCTIONS- CALLED PT AND LEFT HER A MESSAGE STATING NPO AFTER MN, NO ETOH 24 HOURS BEFORE OR AFTER AND NO JEWELRY, NAIL POLISH OR MAKEUP

## 2017-02-20 ENCOUNTER — Encounter
Admission: RE | Disposition: A | Discharge status: Home / Self Care | Payer: Self-pay | Source: Ambulatory Visit | Attending: General Surgery

## 2017-02-20 ENCOUNTER — Ambulatory Visit: Payer: BLUE CROSS/BLUE SHIELD | Admitting: Certified Registered Nurse Anesthetist

## 2017-02-20 ENCOUNTER — Encounter: Payer: Self-pay | Admitting: *Deleted

## 2017-02-20 ENCOUNTER — Ambulatory Visit
Admission: RE | Admit: 2017-02-20 | Discharge: 2017-02-20 | Disposition: A | Discharge status: Home / Self Care | Payer: BLUE CROSS/BLUE SHIELD | Source: Ambulatory Visit | Attending: General Surgery | Admitting: General Surgery

## 2017-02-20 DIAGNOSIS — D241 Benign neoplasm of right breast: Secondary | ICD-10-CM

## 2017-02-20 DIAGNOSIS — K219 Gastro-esophageal reflux disease without esophagitis: Secondary | ICD-10-CM | POA: Insufficient documentation

## 2017-02-20 DIAGNOSIS — Z803 Family history of malignant neoplasm of breast: Secondary | ICD-10-CM | POA: Diagnosis not present

## 2017-02-20 DIAGNOSIS — N6489 Other specified disorders of breast: Secondary | ICD-10-CM | POA: Insufficient documentation

## 2017-02-20 DIAGNOSIS — D4862 Neoplasm of uncertain behavior of left breast: Secondary | ICD-10-CM | POA: Diagnosis not present

## 2017-02-20 DIAGNOSIS — C50912 Malignant neoplasm of unspecified site of left female breast: Secondary | ICD-10-CM | POA: Insufficient documentation

## 2017-02-20 DIAGNOSIS — N6320 Unspecified lump in the left breast, unspecified quadrant: Secondary | ICD-10-CM | POA: Diagnosis present

## 2017-02-20 HISTORY — PX: BREAST BIOPSY: SHX20

## 2017-02-20 HISTORY — PX: BREAST RECONSTRUCTION WITH MASTOPLASTY: SHX6752

## 2017-02-20 HISTORY — PX: MASTECTOMY, PARTIAL: SHX709

## 2017-02-20 LAB — POCT PREGNANCY, URINE: Preg Test, Ur: NEGATIVE

## 2017-02-20 SURGERY — MASTECTOMY PARTIAL
Anesthesia: General | Laterality: Right | Wound class: Clean

## 2017-02-20 MED ORDER — DEXAMETHASONE SODIUM PHOSPHATE 10 MG/ML IJ SOLN
INTRAMUSCULAR | Status: AC
  Filled 2017-02-20: qty 1

## 2017-02-20 MED ORDER — BUPIVACAINE-EPINEPHRINE (PF) 0.5% -1:200000 IJ SOLN
INTRAMUSCULAR | Status: AC
  Filled 2017-02-20: qty 30

## 2017-02-20 MED ORDER — FENTANYL CITRATE (PF) 100 MCG/2ML IJ SOLN
INTRAMUSCULAR | Status: AC
  Filled 2017-02-20: qty 2

## 2017-02-20 MED ORDER — OXYCODONE HCL 5 MG/5ML PO SOLN: 5.0000 mg | Freq: Once | ORAL | Status: DC | PRN

## 2017-02-20 MED ORDER — PHENYLEPHRINE HCL 10 MG/ML IJ SOLN
INTRAMUSCULAR | Status: DC | PRN
  Administered 2017-02-20 (×4): 50 ug via INTRAVENOUS

## 2017-02-20 MED ORDER — SEVOFLURANE IN SOLN
RESPIRATORY_TRACT | Status: AC
  Filled 2017-02-20: qty 250

## 2017-02-20 MED ORDER — HYDROCODONE-ACETAMINOPHEN 5-325 MG PO TABS
ORAL_TABLET | ORAL | Status: AC
  Administered 2017-02-20: 1 via ORAL
  Filled 2017-02-20: qty 1

## 2017-02-20 MED ORDER — LIDOCAINE HCL (CARDIAC) 20 MG/ML IV SOLN
INTRAVENOUS | Status: DC | PRN
  Administered 2017-02-20: 100 mg via INTRAVENOUS

## 2017-02-20 MED ORDER — KETOROLAC TROMETHAMINE 30 MG/ML IJ SOLN
INTRAMUSCULAR | Status: AC
  Filled 2017-02-20: qty 1

## 2017-02-20 MED ORDER — FAMOTIDINE 20 MG PO TABS
20.0000 mg | ORAL_TABLET | Freq: Once | ORAL | Status: AC
Start: 2017-02-20 — End: 2017-02-20
  Administered 2017-02-20: 20 mg via ORAL

## 2017-02-20 MED ORDER — MIDAZOLAM HCL 2 MG/2ML IJ SOLN
INTRAMUSCULAR | Status: DC | PRN
  Administered 2017-02-20: 2 mg via INTRAVENOUS

## 2017-02-20 MED ORDER — FENTANYL CITRATE (PF) 100 MCG/2ML IJ SOLN
INTRAMUSCULAR | Status: DC | PRN
  Administered 2017-02-20 (×4): 25 ug via INTRAVENOUS

## 2017-02-20 MED ORDER — FENTANYL CITRATE (PF) 100 MCG/2ML IJ SOLN
INTRAMUSCULAR | Status: AC
  Administered 2017-02-20: 25 ug via INTRAVENOUS
  Filled 2017-02-20: qty 2

## 2017-02-20 MED ORDER — BUPIVACAINE-EPINEPHRINE (PF) 0.5% -1:200000 IJ SOLN
INTRAMUSCULAR | Status: DC | PRN
  Administered 2017-02-20: 30 mL via PERINEURAL

## 2017-02-20 MED ORDER — FAMOTIDINE 20 MG PO TABS
ORAL_TABLET | ORAL | Status: AC
  Administered 2017-02-20: 20 mg via ORAL
  Filled 2017-02-20: qty 1

## 2017-02-20 MED ORDER — FENTANYL CITRATE (PF) 100 MCG/2ML IJ SOLN
25.0000 ug | INTRAMUSCULAR | Status: DC | PRN
  Administered 2017-02-20 (×5): 25 ug via INTRAVENOUS

## 2017-02-20 MED ORDER — LACTATED RINGERS IV SOLN
INTRAVENOUS | Status: DC
  Administered 2017-02-20 (×2): via INTRAVENOUS

## 2017-02-20 MED ORDER — HYDROCODONE-ACETAMINOPHEN 5-325 MG PO TABS: 1.0000 | ORAL_TABLET | ORAL | 0 refills | Status: AC | PRN

## 2017-02-20 MED ORDER — ONDANSETRON HCL 4 MG/2ML IJ SOLN
INTRAMUSCULAR | Status: AC
  Filled 2017-02-20: qty 2

## 2017-02-20 MED ORDER — LIDOCAINE HCL (PF) 2 % IJ SOLN
INTRAMUSCULAR | Status: AC
  Filled 2017-02-20: qty 2

## 2017-02-20 MED ORDER — DEXAMETHASONE SODIUM PHOSPHATE 10 MG/ML IJ SOLN
INTRAMUSCULAR | Status: DC | PRN
  Administered 2017-02-20: 10 mg via INTRAVENOUS

## 2017-02-20 MED ORDER — PROPOFOL 10 MG/ML IV BOLUS
INTRAVENOUS | Status: AC
  Filled 2017-02-20: qty 20

## 2017-02-20 MED ORDER — ACETAMINOPHEN 10 MG/ML IV SOLN
INTRAVENOUS | Status: DC | PRN
  Administered 2017-02-20: 1000 mg via INTRAVENOUS

## 2017-02-20 MED ORDER — ACETAMINOPHEN 10 MG/ML IV SOLN
INTRAVENOUS | Status: AC
  Filled 2017-02-20: qty 100

## 2017-02-20 MED ORDER — HYDROCODONE-ACETAMINOPHEN 5-325 MG PO TABS
1.0000 | ORAL_TABLET | ORAL | Status: DC | PRN
  Administered 2017-02-20: 1 via ORAL

## 2017-02-20 MED ORDER — OXYCODONE HCL 5 MG PO TABS: 5.0000 mg | ORAL_TABLET | Freq: Once | ORAL | Status: DC | PRN

## 2017-02-20 MED ORDER — MIDAZOLAM HCL 2 MG/2ML IJ SOLN
INTRAMUSCULAR | Status: AC
  Filled 2017-02-20: qty 2

## 2017-02-20 MED ORDER — PROPOFOL 10 MG/ML IV BOLUS
INTRAVENOUS | Status: DC | PRN
  Administered 2017-02-20: 200 mg via INTRAVENOUS

## 2017-02-20 MED ORDER — KETOROLAC TROMETHAMINE 30 MG/ML IJ SOLN
INTRAMUSCULAR | Status: DC | PRN
  Administered 2017-02-20: 30 mg via INTRAVENOUS

## 2017-02-20 MED ORDER — ONDANSETRON HCL 4 MG/2ML IJ SOLN
INTRAMUSCULAR | Status: DC | PRN
  Administered 2017-02-20: 4 mg via INTRAVENOUS

## 2017-02-20 SURGICAL SUPPLY — 58 items
APPLIER CLIP 11 MED OPEN (CLIP) ×4
BANDAGE ELASTIC 6 LF NS (GAUZE/BANDAGES/DRESSINGS) ×4 IMPLANT
BLADE SURG 15 STRL SS SAFETY (BLADE) ×8 IMPLANT
BNDG GAUZE 4.5X4.1 6PLY STRL (MISCELLANEOUS) ×4 IMPLANT
BRA SURGICAL 2XLRG (MISCELLANEOUS) ×4 IMPLANT
BULB RESERV EVAC DRAIN JP 100C (MISCELLANEOUS) IMPLANT
CANISTER SUCT 1200ML W/VALVE (MISCELLANEOUS) ×4 IMPLANT
CHLORAPREP W/TINT 26ML (MISCELLANEOUS) ×4 IMPLANT
CLIP APPLIE 11 MED OPEN (CLIP) ×2 IMPLANT
CLOSURE WOUND 1/2 X4 (GAUZE/BANDAGES/DRESSINGS) ×1
CNTNR SPEC 2.5X3XGRAD LEK (MISCELLANEOUS) ×2
CONT SPEC 4OZ STER OR WHT (MISCELLANEOUS) ×2
CONTAINER SPEC 2.5X3XGRAD LEK (MISCELLANEOUS) ×2 IMPLANT
COVER PROBE FLX POLY STRL (MISCELLANEOUS) ×4 IMPLANT
DEVICE DUBIN SPECIMEN MAMMOGRA (MISCELLANEOUS) IMPLANT
DRAIN CHANNEL JP 15F RND 16 (MISCELLANEOUS) IMPLANT
DRAPE LAPAROTOMY 100X77 ABD (DRAPES) ×4 IMPLANT
DRAPE LAPAROTOMY TRNSV 106X77 (MISCELLANEOUS) ×4 IMPLANT
DRSG TELFA 3X8 NADH (GAUZE/BANDAGES/DRESSINGS) ×4 IMPLANT
DRSG TELFA 4X3 1S NADH ST (GAUZE/BANDAGES/DRESSINGS) ×3 IMPLANT
ELECT CAUTERY BLADE TIP 2.5 (TIP) ×4
ELECT REM PT RETURN 9FT ADLT (ELECTROSURGICAL) ×4
ELECTRODE CAUTERY BLDE TIP 2.5 (TIP) ×2 IMPLANT
ELECTRODE REM PT RTRN 9FT ADLT (ELECTROSURGICAL) ×2 IMPLANT
GAUZE FLUFF 18X24 1PLY STRL (GAUZE/BANDAGES/DRESSINGS) ×4 IMPLANT
GAUZE SPONGE 4X4 12PLY STRL (GAUZE/BANDAGES/DRESSINGS) ×4 IMPLANT
GLOVE BIO SURGEON STRL SZ7.5 (GLOVE) ×4 IMPLANT
GLOVE INDICATOR 8.0 STRL GRN (GLOVE) ×4 IMPLANT
GOWN STRL REUS W/ TWL LRG LVL3 (GOWN DISPOSABLE) ×4 IMPLANT
GOWN STRL REUS W/TWL LRG LVL3 (GOWN DISPOSABLE) ×4
HARMONIC SCALPEL FOCUS (MISCELLANEOUS) ×4 IMPLANT
KIT RM TURNOVER STRD PROC AR (KITS) ×4 IMPLANT
LABEL OR SOLS (LABEL) ×4 IMPLANT
MARGIN MAP 10MM (MISCELLANEOUS) ×4 IMPLANT
MARKER CLIP DUAL 17X12 BIODUR (CLIP) ×8 IMPLANT
NDL SAFETY 22GX1.5 (NEEDLE) ×4 IMPLANT
NEEDLE HYPO 25X1 1.5 SAFETY (NEEDLE) ×8 IMPLANT
PACK BASIN MINOR ARMC (MISCELLANEOUS) ×4 IMPLANT
SHEARS FOC LG CVD HARMONIC 17C (MISCELLANEOUS) IMPLANT
SPONGE LAP 18X18 5 PK (GAUZE/BANDAGES/DRESSINGS) ×4 IMPLANT
STRIP CLOSURE SKIN 1/2X4 (GAUZE/BANDAGES/DRESSINGS) ×3 IMPLANT
SURGI-BRA 2X (MISCELLANEOUS) IMPLANT
SUT ETHILON 3-0 FS-10 30 BLK (SUTURE) ×4
SUT SILK 2 0 (SUTURE) ×2
SUT SILK 2-0 18XBRD TIE 12 (SUTURE) ×2 IMPLANT
SUT VIC AB 2-0 CT1 (SUTURE) ×8 IMPLANT
SUT VIC AB 2-0 CT1 27 (SUTURE) ×4
SUT VIC AB 2-0 CT1 TAPERPNT 27 (SUTURE) ×4 IMPLANT
SUT VIC AB 4-0 FS2 27 (SUTURE) ×8 IMPLANT
SUT VICRYL+ 3-0 144IN (SUTURE) ×4 IMPLANT
SUTURE EHLN 3-0 FS-10 30 BLK (SUTURE) ×2 IMPLANT
SWABSTK COMLB BENZOIN TINCTURE (MISCELLANEOUS) ×4 IMPLANT
SYR BULB IRRIG 60ML STRL (SYRINGE) ×4 IMPLANT
SYR CONTROL 10ML (SYRINGE) ×4 IMPLANT
SYRINGE 10CC LL (SYRINGE) ×4 IMPLANT
SYS BIOPSY MAX CORE 14GX10 (NEEDLE) ×4 IMPLANT
TAPE TRANSPORE STRL 2 31045 (GAUZE/BANDAGES/DRESSINGS) ×4 IMPLANT
WATER STERILE IRR 1000ML POUR (IV SOLUTION) ×4 IMPLANT

## 2017-02-20 NOTE — Op Note (Signed)
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  Postoperative diagnosis: Same.  Operative procedure: 1) core biopsy of fibroadenomas of the right breast at the 6:00 position 1 and 4 cm from the nipple. 2) wide excision malignant phyllodes tumor of the left breast with mastoplasty.  Operating surgeon: Hervey Ard, M.D.  Anesthesia: Gen. by LMA, Marcaine 0.5% with 1-200,000 epinephrine: 30 mL.  Estimated blood loss: 50 mL.  Clinical note: This 36 year old woman was noted to have a left breast mass and core biopsy showed evidence of a malignant phyllodes tumor. She has multiple other breast masses thought to represent fibroadenomas. A lesion in the 4:00 position of the left breast was biopsied last week to confirm a benign histology and this was indeed the case. There are 2 lesions the right breast which are thought to represent fibroadenomas but based on the contralateral lesion were felt to be a candidate for early biopsy. The patient desired breast conservation and plans were for resection of the 7 cm left breast tumor with mastopexy.  Operative note: With the patient under adequate general anesthesia the right breast was prepped with ChloraPrep and draped. Small stab incisions were made and a 14-gauge core biopsy device was used to take samples from both the dominant lesion at the 6:00 position, 1 cm from the nipple which was about 4 cm in diameter and placement of a coil marker in this lesion and then separate samples were taken from the 2 cm lesion at the 6:00 position, 4 cm from the nipple with placement of a ribbon marker. Steri-Strips were applied to these wounds area  The left breast chest and neck cells then prepped with ChloraPrep and draped. Due to the 7 cm diameter of the tumor abutting the retroareolar space it was elected to make use of a "batwing" incision to allow resection of the overlying skin as the lesion approached within  about 4 mm of the skin in multiple areas at this location. The skin was incised sharply and the remaining dissection was completed with electrocautery. The nipple areolar complex was elevated inferiorly and a 1 cm margin of tissue around the mass was excised with hemostasis achieved by electrocautery and 3-0 Vicryl ties. The gross specimen was orientated and sent to pathology and the margins were found to be 1 cm. At this time having removed a 6 x 6 x 9 cm lesion from the breast a mastopexy was required. The breast was elevated off the underlying pectoralis and serratus fascia to allow the deep edge to be anchored or superiorly to provide volume in the retroareolar area. This was done with interrupted 2-0 Vicryl figure-of-eight sutures. Multiple layers of similar sutures were used to reconstruct the breast volume. There was slight tethering of the skin about 3 cm from the superior edge and this area was freed as a 5 mm flap with electrocautery. The final edges were approximated with interrupted 2-0 Vicryl simple sutures. The area of the periareolar tissue from the 9 to 3:00 position was approximated in its new location with interrupted 4-0 Vicryl subcuticular sutures. The medial and lateral wings were closed with a running 4-0 Vicryl subcuticular suture. Benzoin and Steri-Strips followed by a Telfa dressing was applied. Fluff gauze and a surgical bra was placed.  The patient tolerated the procedure well and was taken the recovery room in stable condition.

## 2017-02-20 NOTE — Anesthesia Procedure Notes (Signed)
Procedure Name: LMA Insertion Performed by: Abril Cappiello Pre-anesthesia Checklist: Patient identified, Patient being monitored, Timeout performed, Emergency Drugs available and Suction available Patient Re-evaluated:Patient Re-evaluated prior to inductionOxygen Delivery Method: Circle system utilized Preoxygenation: Pre-oxygenation with 100% oxygen Intubation Type: IV induction Ventilation: Mask ventilation without difficulty LMA: LMA inserted LMA Size: 4.0 Tube type: Oral Number of attempts: 1 Placement Confirmation: positive ETCO2 and breath sounds checked- equal and bilateral Tube secured with: Tape Dental Injury: Teeth and Oropharynx as per pre-operative assessment        

## 2017-02-20 NOTE — Discharge Instructions (Signed)

## 2017-02-20 NOTE — Transfer of Care (Signed)
Immediate Anesthesia Transfer of Care Note  Patient: Kim Barker  Procedure(s) Performed: Procedure(s): MASTECTOMY PARTIAL (Left) BREAST RECONSTRUCTION WITH MASTOPLASTY (Left) BREAST BIOPSY (Right)  Patient Location: PACU  Anesthesia Type:General  Level of Consciousness: sedated  Airway & Oxygen Therapy: Patient Spontanous Breathing and Patient connected to face mask oxygen  Post-op Assessment: Report given to RN and Post -op Vital signs reviewed and stable  Post vital signs: Reviewed and stable  Last Vitals:  Vitals:   02/20/17 1138 02/20/17 1554  BP: 134/87 115/82  Pulse: 93 (!) 102  Resp: 16 18  Temp: 36.8 C 36.3 C    Last Pain:  Vitals:   02/20/17 1138  TempSrc: Oral  PainSc: 5       Patients Stated Pain Goal: 2 (03/49/17 9150)  Complications: No apparent anesthesia complications

## 2017-02-20 NOTE — Anesthesia Preprocedure Evaluation (Signed)
Anesthesia Evaluation  Patient identified by MRN, date of birth, ID band Patient awake    Reviewed: Allergy & Precautions, H&P , NPO status , Patient's Chart, lab work & pertinent test results  History of Anesthesia Complications Negative for: history of anesthetic complications  Airway Mallampati: III  TM Distance: >3 FB Neck ROM: full    Dental  (+) Poor Dentition, Chipped, Caps   Pulmonary neg pulmonary ROS, neg shortness of breath,    Pulmonary exam normal breath sounds clear to auscultation       Cardiovascular Exercise Tolerance: Good (-) angina(-) Past MI and (-) DOE negative cardio ROS Normal cardiovascular exam Rhythm:regular Rate:Normal     Neuro/Psych  Headaches,  Neuromuscular disease negative psych ROS   GI/Hepatic Neg liver ROS, GERD  Controlled and Medicated,  Endo/Other  negative endocrine ROS  Renal/GU      Musculoskeletal   Abdominal   Peds  Hematology negative hematology ROS (+)   Anesthesia Other Findings Past Medical History: No date: Allergy No date: Breast mass     Comment: left breast present x 2 months No date: [facility])     Comment: breast left No date: GERD (gastroesophageal reflux disease)     Comment: PT WAS ON ACID REDUCER BUT HAS NOT TAKEN IN               MONTHS No date: Headache     Comment: MIGRAINES  Past Surgical History: 36 years old: BREAST LUMPECTOMY Right     Comment: Benign 36 years old: HYMENPLASTY 2004: [place]     Reproductive/Obstetrics negative OB ROS                             Anesthesia Physical Anesthesia Plan  ASA: III  Anesthesia Plan: General LMA   Post-op Pain Management:    Induction:   Airway Management Planned:   Additional Equipment:   Intra-op Plan:   Post-operative Plan:   Informed Consent: I have reviewed the patients History and Physical, chart, labs and discussed the procedure  including the risks, benefits and alternatives for the proposed anesthesia with the patient or authorized representative who has indicated his/her understanding and acceptance.   Dental Advisory Given  Plan Discussed with: Anesthesiologist, CRNA and Surgeon  Anesthesia Plan Comments:         Anesthesia Quick Evaluation

## 2017-02-20 NOTE — Anesthesia Post-op Follow-up Note (Cosign Needed)
Anesthesia QCDR form completed.        

## 2017-02-20 NOTE — H&P (Signed)
No change in clinical exam or history. For right core biopsy and excision left breast phylloides tumor.

## 2017-02-20 NOTE — Anesthesia Postprocedure Evaluation (Signed)
Anesthesia Post Note  Patient: Marketing executive  Procedure(s) Performed: Procedure(s) (LRB): MASTECTOMY PARTIAL (Left) BREAST RECONSTRUCTION WITH MASTOPLASTY (Left) BREAST BIOPSY (Right)  Patient location during evaluation: PACU Anesthesia Type: General Level of consciousness: awake and alert and oriented Pain management: pain level controlled Vital Signs Assessment: post-procedure vital signs reviewed and stable Respiratory status: spontaneous breathing Cardiovascular status: blood pressure returned to baseline Anesthetic complications: no     Last Vitals:  Vitals:   02/20/17 1633 02/20/17 1639  BP:  117/78  Pulse: 99 98  Resp: 17 14  Temp:      Last Pain:  Vitals:   02/20/17 1639  TempSrc:   PainSc: Asleep                 Landree Fernholz

## 2017-02-21 ENCOUNTER — Encounter: Payer: Self-pay | Admitting: General Surgery

## 2017-02-24 ENCOUNTER — Encounter: Payer: Self-pay | Admitting: General Surgery

## 2017-02-24 ENCOUNTER — Ambulatory Visit (INDEPENDENT_AMBULATORY_CARE_PROVIDER_SITE_OTHER): Payer: BLUE CROSS/BLUE SHIELD | Admitting: General Surgery

## 2017-02-24 VITALS — BP 120/80 | HR 76 | Resp 12 | Ht 62.0 in | Wt 203.0 lb

## 2017-02-24 DIAGNOSIS — D4860 Neoplasm of uncertain behavior of unspecified breast: Secondary | ICD-10-CM

## 2017-02-24 NOTE — Progress Notes (Signed)
Patient ID: Kim Barker, female   DOB: March 17, 1981, 36 y.o.   MRN: [id number]  Chief Complaint  Patient presents with  . Routine Post Op    HPI [name] is a 36 y.o. female here today for her follow up left breast lumpectomy  done 02/20/2017. Patient states she is doing well.  HPI  Past Medical History:  Diagnosis Date  . Allergy   . Breast mass    left breast present x 2 months  . Cancer [facility])    breast left  . GERD (gastroesophageal reflux disease)    PT WAS ON ACID REDUCER BUT HAS NOT TAKEN IN MONTHS  . Headache    MIGRAINES    Past Surgical History:  Procedure Laterality Date  . BREAST BIOPSY Right 02/20/2017   Procedure: BREAST BIOPSY;  Surgeon: Robert Bellow, MD;  Location: [facility] ORS;  Service: General;  Laterality: Right;  . BREAST LUMPECTOMY Right 36 years old   Benign  . BREAST RECONSTRUCTION WITH MASTOPLASTY Left 02/20/2017   Procedure: BREAST RECONSTRUCTION WITH MASTOPLASTY;  Surgeon: Robert Bellow, MD;  Location: [facility] ORS;  Service: General;  Laterality: Left;  . HYMENPLASTY  36 years old  . MASTECTOMY, PARTIAL Left 02/20/2017   Procedure: MASTECTOMY PARTIAL;  Surgeon: Robert Bellow, MD;  Location: [facility] ORS;  Service: General;  Laterality: Left;  . WISDOM TOOTH EXTRACTION  2004    Family History  Problem Relation Age of Onset  . Diabetes Mother   . Asthma Father   . Diabetes Father   . Asthma Brother   . Diabetes Maternal Grandmother   . COPD Paternal Grandfather   . Heart disease Paternal Grandfather   . Breast cancer Maternal Aunt 35  . Breast cancer Maternal Aunt 88  . Breast cancer Maternal Aunt 27    Social History Social History  Substance Use Topics  . Smoking status: Never Smoker  . Smokeless tobacco: Never Used  . Alcohol use Yes     Comment: OCC    No Known Allergies  Current Outpatient Prescriptions  Medication Sig Dispense Refill  . diphenhydramine-acetaminophen (TYLENOL PM) 25-500 MG TABS tablet Take 2 tablets by  mouth at bedtime as needed (sleep/pain).    Marland Kitchen HYDROcodone-acetaminophen (NORCO) 5-325 MG tablet Take 1-2 tablets by mouth every 4 (four) hours as needed for moderate pain. 30 tablet 0  . ibuprofen (ADVIL,MOTRIN) 200 MG tablet Take 800 mg by mouth 2 (two) times daily as needed for headache or moderate pain.      No current facility-administered medications for this visit.     Review of Systems Review of Systems  Constitutional: Negative.   Respiratory: Negative.   Cardiovascular: Negative.     Blood pressure 120/80, pulse 76, resp. rate 12, height 5\' 2"  (1.575 m), weight 203 lb (92.1 kg), last menstrual period 02/06/2017.  Physical Exam Physical Exam  Pulmonary/Chest:      Data Reviewed Pathology pending.  Assessment    Doing well status post wide excision and mastoplasty of a large malignant phyllodes tumor of the left breast, core biopsy of the right breast.   Plan    The patient may begin showering. She was encouraged to wear her bra day and night for support. Heat for comfort, taking care to be sure that a heating pad extends either to the lateral chest wall or to the contralateral chest wall be sure she senses temperatures appropriately.  Follow-up in one week.  She will be notified when pathology is available.  This information has been scribed by Gaspar Cola CMA.   Robert Bellow 02/24/2017, 2:53 PM

## 2017-02-24 NOTE — Patient Instructions (Signed)
Return in two weeks.  

## 2017-02-25 ENCOUNTER — Telehealth: Payer: Self-pay | Admitting: General Surgery

## 2017-02-25 NOTE — Telephone Encounter (Signed)
Patient notified that the left breast lesion has been sent out for subspecialty second opinion. Will notify her of results when final report received.

## 2017-03-03 ENCOUNTER — Encounter: Payer: Self-pay | Admitting: General Surgery

## 2017-03-03 ENCOUNTER — Ambulatory Visit (INDEPENDENT_AMBULATORY_CARE_PROVIDER_SITE_OTHER): Payer: BLUE CROSS/BLUE SHIELD | Admitting: General Surgery

## 2017-03-03 VITALS — BP 120/82 | HR 78 | Resp 12 | Ht 62.0 in | Wt 202.0 lb

## 2017-03-03 DIAGNOSIS — D241 Benign neoplasm of right breast: Secondary | ICD-10-CM

## 2017-03-03 DIAGNOSIS — D242 Benign neoplasm of left breast: Secondary | ICD-10-CM

## 2017-03-03 DIAGNOSIS — D4860 Neoplasm of uncertain behavior of unspecified breast: Secondary | ICD-10-CM

## 2017-03-03 NOTE — Progress Notes (Signed)
Patient ID: Kim Barker, female   DOB: [birth year], 36 y.o.   MRN: [id number]  Chief Complaint  Patient presents with  . Routine Post Op    HPI Kim Barker is a 36 y.o. female here today for her follow up left breast lumpectomy  done 02/20/2017. Patient states she is doing well.  HPI  Past Medical History:  Diagnosis Date  . Allergy   . Breast mass    left breast present x 2 months  . Cancer [facility])    breast left  . GERD (gastroesophageal reflux disease)    PT WAS ON ACID REDUCER BUT HAS NOT TAKEN IN MONTHS  . Headache    MIGRAINES    Past Surgical History:  Procedure Laterality Date  . BREAST BIOPSY Right 02/20/2017   Procedure: BREAST BIOPSY;  Surgeon: Robert Bellow, MD;  Location: [facility] ORS;  Service: General;  Laterality: Right;  . BREAST LUMPECTOMY Right 36 years old   Benign  . BREAST RECONSTRUCTION WITH MASTOPLASTY Left 02/20/2017   Procedure: BREAST RECONSTRUCTION WITH MASTOPLASTY;  Surgeon: Robert Bellow, MD;  Location: [facility] ORS;  Service: General;  Laterality: Left;  . HYMENPLASTY  36 years old  . MASTECTOMY, PARTIAL Left 02/20/2017   Procedure: MASTECTOMY PARTIAL;  Surgeon: Robert Bellow, MD;  Location: [facility] ORS;  Service: General;  Laterality: Left;  . WISDOM TOOTH EXTRACTION  2004    Family History  Problem Relation Age of Onset  . Diabetes Mother   . Asthma Father   . Diabetes Father   . Asthma Brother   . Diabetes Maternal Grandmother   . COPD Paternal Grandfather   . Heart disease Paternal Grandfather   . Breast cancer Maternal Aunt 11  . Breast cancer Maternal Aunt 66  . Breast cancer Maternal Aunt 19    Social History Social History  Substance Use Topics  . Smoking status: Never Smoker  . Smokeless tobacco: Never Used  . Alcohol use Yes     Comment: OCC    No Known Allergies  Current Outpatient Prescriptions  Medication Sig Dispense Refill  . diphenhydramine-acetaminophen (TYLENOL PM) 25-500 MG TABS tablet Take 2 tablets by  mouth at bedtime as needed (sleep/pain).    Marland Kitchen HYDROcodone-acetaminophen (NORCO) 5-325 MG tablet Take 1-2 tablets by mouth every 4 (four) hours as needed for moderate pain. 30 tablet 0  . ibuprofen (ADVIL,MOTRIN) 200 MG tablet Take 800 mg by mouth 2 (two) times daily as needed for headache or moderate pain.      No current facility-administered medications for this visit.     Review of Systems Review of Systems  Constitutional: Negative.   Respiratory: Negative.   Cardiovascular: Negative.     Blood pressure 120/82, pulse 78, resp. rate 12, height 5\' 2"  (1.575 m), weight 202 lb (91.6 kg), last menstrual period 02/06/2017.  Physical Exam Physical Exam  Pulmonary/Chest:      Data Reviewed Verbal report from pathology received this evening was that margins were close but negative. Fibroadenomatous changes in the right breast.  Assessment    Doing well status post resection of malignant phyllodes tumor of the left breast and right breast biopsy.    Plan    The role of postoperative radiation therapy on the left side was reviewed.    Patient to return in two weeks and return to work on 03/13/2017. HPI, Physical Exam, Assessment and Plan have been scribed under the direction and in the presence of Hervey Ard, MD.  Gaspar Cola, CMA  I have completed the exam and reviewed the above documentation for accuracy and completeness.  I agree with the above.  Haematologist has been used and any errors in dictation or transcription are unintentional.  Hervey Ard, M.D., F.A.C.S.  Robert Bellow 03/04/2017, 8:37 PM   Patient has been scheduled for an appointment to see Dr. Baruch Gouty at the [facility] for 03-12-17 at 10:30 am. She will see Dr. Grayland Ormond after at 11:30 am as originally scheduled.   Dominga Ferry, CMA

## 2017-03-03 NOTE — Patient Instructions (Signed)
Patient to return in two weeks and return to work on 03/13/2017.

## 2017-03-04 DIAGNOSIS — D241 Benign neoplasm of right breast: Secondary | ICD-10-CM | POA: Insufficient documentation

## 2017-03-04 DIAGNOSIS — D242 Benign neoplasm of left breast: Secondary | ICD-10-CM

## 2017-03-05 ENCOUNTER — Ambulatory Visit: Payer: BLUE CROSS/BLUE SHIELD | Admitting: Oncology

## 2017-03-11 ENCOUNTER — Other Ambulatory Visit: Payer: Self-pay | Admitting: General Surgery

## 2017-03-11 DIAGNOSIS — Z853 Personal history of malignant neoplasm of breast: Secondary | ICD-10-CM

## 2017-03-11 NOTE — Progress Notes (Signed)
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  INTERVAL HISTORY: Patient returns to clinic today for further evaluation and discussion of her pathology results. She tolerated her lumpectomy well without significant side effects. She currently feels well and is asymptomatic. She has no neurologic complaints. She denies any recent fevers or illnesses. She has a good appetite and denies weight loss. She has no chest pain or shortness of breath. She denies any nausea, vomiting, constipation, or diarrhea. She has no urinary complaints. Patient offers no further specific complaints.  REVIEW OF SYSTEMS:   Review of Systems  Constitutional: Negative.  Negative for fever, malaise/fatigue and weight loss.  Respiratory: Negative.  Negative for cough and shortness of breath.   Cardiovascular: Negative.  Negative for chest pain and leg swelling.  Gastrointestinal: Negative.  Negative for abdominal pain.  Genitourinary: Negative.   Musculoskeletal: Negative.   Neurological: Negative.  Negative for sensory change and weakness.  Psychiatric/Behavioral: The patient is nervous/anxious.     As per HPI. Otherwise, a complete review of systems is negative.  PAST MEDICAL HISTORY: Past Medical History:  Diagnosis Date  . Allergy   . Breast cancer, left ([place])    Pt had partial mastectomy 02/20/2017.   . Breast mass    left breast present x 2 months  . GERD (gastroesophageal reflux disease)    PT WAS ON ACID REDUCER BUT HAS NOT TAKEN IN MONTHS  . Headache    MIGRAINES    PAST SURGICAL HISTORY: Past Surgical History:  Procedure Laterality  Date  . BREAST BIOPSY Right 02/20/2017   Procedure: BREAST BIOPSY;  Surgeon: Robert Bellow, MD;  Location: [facility] ORS;  Service: General;  Laterality: Right;  . BREAST LUMPECTOMY Right 36 years old   Benign  . BREAST RECONSTRUCTION WITH MASTOPLASTY Left 02/20/2017   Procedure: BREAST RECONSTRUCTION WITH MASTOPLASTY;  Surgeon: Robert Bellow, MD;  Location: [facility] ORS;  Service: General;  Laterality: Left;  . HYMENPLASTY  36 years old  . MASTECTOMY, PARTIAL Left 02/20/2017   Procedure: MASTECTOMY PARTIAL;  Surgeon: Robert Bellow, MD;  Location: [facility] ORS;  Service: General;  Laterality: Left;  . WISDOM TOOTH EXTRACTION  2004    FAMILY HISTORY: Family History  Problem Relation Age of Onset  . Diabetes Mother   . Asthma Father   . Diabetes Father   . Asthma Brother   . Diabetes Maternal Grandmother   . COPD Paternal Grandfather   . Heart disease Paternal Grandfather   . Breast cancer Maternal Aunt 93  . Breast cancer Maternal Aunt 103  . Breast cancer Maternal Aunt 40    ADVANCED DIRECTIVES (Y/N):  N  HEALTH MAINTENANCE: Social History  Substance Use Topics  . Smoking status: Never Smoker  . Smokeless tobacco: Never Used  . Alcohol use Yes     Comment: OCC     Colonoscopy:  PAP:  Bone density:  Lipid panel:  No Known Allergies  Current Outpatient Prescriptions  Medication Sig Dispense Refill  . cephALEXin (KEFLEX) 500 MG capsule Take 1 capsule (500 mg total) by mouth 2 (two) times daily. 14 capsule 0  . diphenhydramine-acetaminophen (TYLENOL PM) 25-500 MG TABS tablet Take 2 tablets  by mouth at bedtime as needed (sleep/pain).    . fluconazole (DIFLUCAN) 100 MG tablet Take 1 tablet (100 mg total) by mouth daily. 7 tablet 0  . HYDROcodone-acetaminophen (NORCO) 5-325 MG tablet Take 1-2 tablets by mouth every 4 (four) hours as needed for moderate pain. 30 tablet 0  . ibuprofen (ADVIL,MOTRIN) 200 MG tablet Take 800 mg by mouth 2 (two) times daily as needed for headache or  moderate pain.      No current facility-administered medications for this visit.     OBJECTIVE: There were no vitals filed for this visit.   There is no height or weight on file to calculate BMI.    ECOG FS:0 - Asymptomatic  General: Well-developed, well-nourished, no acute distress. Eyes: Pink conjunctiva, anicteric sclera. Breasts: Well-healed surgical scar left breast. Right breast mass is also evident. Lungs: Clear to auscultation bilaterally. Heart: Regular rate and rhythm. No rubs, murmurs, or gallops. Abdomen: Soft, nontender, nondistended. No organomegaly noted, normoactive bowel sounds. Musculoskeletal: No edema, cyanosis, or clubbing. Neuro: Alert, answering all questions appropriately. Cranial nerves grossly intact. Skin: No rashes or petechiae noted. Psych: Normal affect. Lymphatics: No cervical, calvicular, axillary or inguinal LAD.   LAB RESULTS:  Lab Results  Component Value Date   NA 137 02/05/2014   K 3.7 02/05/2014   CL 106 02/05/2014   CO2 27 02/05/2014   GLUCOSE 66 02/05/2014   BUN 8 02/05/2014   CREATININE 0.80 02/05/2014   CALCIUM 8.7 02/05/2014   PROT 7.7 02/05/2014   ALBUMIN 3.6 02/05/2014   AST 16 02/05/2014   ALT 11 (L) 02/05/2014   ALKPHOS 49 02/05/2014   BILITOT 0.4 02/05/2014   GFRNONAA >60 02/05/2014   GFRAA >60 02/05/2014    Lab Results  Component Value Date   WBC 7.0 02/05/2014   HGB 12.3 02/05/2014   HCT 36.3 02/05/2014   MCV 88 02/05/2014   PLT 252 02/05/2014     STUDIES: Ct Chest W Contrast  Result Date: 03/17/2017 CLINICAL DATA:  LEFT breast cancer with partial mastectomy. Mastectomy 02/20/2017. EXAM: CT CHEST, ABDOMEN, AND PELVIS WITH CONTRAST TECHNIQUE: Multidetector CT imaging of the chest, abdomen and pelvis was performed following the standard protocol during bolus administration of intravenous contrast. CONTRAST:  163m ISOVUE-300 IOPAMIDOL (ISOVUE-300) INJECTION 61% COMPARISON:  None. FINDINGS: CT CHEST FINDINGS  Cardiovascular: No significant vascular findings. Normal heart size. No pericardial effusion. Mediastinum/Nodes: No axillary adenopathy. Bilateral rounded breast lesions. Largest lesion in the RIGHT breast measures 4.2 cm. Multiple surgical biopsy clips noted. No supraclavicular adenopathy. No internal mammary adenopathy. No mediastinal hilar adenopathy. No pericardial fluid. Esophagus normal. Lungs/Pleura: No suspicious pulmonary nodules. Airways normal. No pleural nodularity. Musculoskeletal: No aggressive osseous lesion. CT ABDOMEN AND PELVIS FINDINGS Hepatobiliary:  No focal hepatic lesion.  Normal gallbladder. Pancreas: Pancreas is normal. No ductal dilatation. No pancreatic inflammation. Spleen: Normal spleen Adrenals/urinary tract: Adrenal glands and kidneys are normal. The ureters and bladder normal. Stomach/Bowel: Stomach, small bowel, appendix, and cecum are normal. The colon and rectosigmoid colon are normal. Vascular/Lymphatic: Abdominal aorta is normal caliber. There is no retroperitoneal or periportal lymphadenopathy. No pelvic lymphadenopathy. Reproductive: Uterus and ovaries normal. Other: No peritoneal nodularity no omental thickening. Musculoskeletal: Low-density lesion measuring 6 mm within the L2 vertebral body (image 65, series 2). IMPRESSION: Chest Impression: 1. Bilateral breast nodules and masses. 2. No axillary adenopathy.  No intrathoracic adenopathy. 3. No pulmonary nodules. Abdomen / Pelvis Impression: 1. No metastatic disease in the abdomen or pelvis soft tissues. 2. Single  lucent lesion the L2 vertebral body. Unlikely solitary metastasis. Recommend additional follow-up versus MRI or FDG PET scan if clinical suspicion sufficient for bone metastasis. Electronically Signed   By: Suzy Bouchard M.D.   On: 03/17/2017 17:47   Ct Abdomen Pelvis W Contrast  Result Date: 03/17/2017 CLINICAL DATA:  LEFT breast cancer with partial mastectomy. Mastectomy 02/20/2017. EXAM: CT CHEST, ABDOMEN,  AND PELVIS WITH CONTRAST TECHNIQUE: Multidetector CT imaging of the chest, abdomen and pelvis was performed following the standard protocol during bolus administration of intravenous contrast. CONTRAST:  140m ISOVUE-300 IOPAMIDOL (ISOVUE-300) INJECTION 61% COMPARISON:  None. FINDINGS: CT CHEST FINDINGS Cardiovascular: No significant vascular findings. Normal heart size. No pericardial effusion. Mediastinum/Nodes: No axillary adenopathy. Bilateral rounded breast lesions. Largest lesion in the RIGHT breast measures 4.2 cm. Multiple surgical biopsy clips noted. No supraclavicular adenopathy. No internal mammary adenopathy. No mediastinal hilar adenopathy. No pericardial fluid. Esophagus normal. Lungs/Pleura: No suspicious pulmonary nodules. Airways normal. No pleural nodularity. Musculoskeletal: No aggressive osseous lesion. CT ABDOMEN AND PELVIS FINDINGS Hepatobiliary:  No focal hepatic lesion.  Normal gallbladder. Pancreas: Pancreas is normal. No ductal dilatation. No pancreatic inflammation. Spleen: Normal spleen Adrenals/urinary tract: Adrenal glands and kidneys are normal. The ureters and bladder normal. Stomach/Bowel: Stomach, small bowel, appendix, and cecum are normal. The colon and rectosigmoid colon are normal. Vascular/Lymphatic: Abdominal aorta is normal caliber. There is no retroperitoneal or periportal lymphadenopathy. No pelvic lymphadenopathy. Reproductive: Uterus and ovaries normal. Other: No peritoneal nodularity no omental thickening. Musculoskeletal: Low-density lesion measuring 6 mm within the L2 vertebral body (image 65, series 2). IMPRESSION: Chest Impression: 1. Bilateral breast nodules and masses. 2. No axillary adenopathy.  No intrathoracic adenopathy. 3. No pulmonary nodules. Abdomen / Pelvis Impression: 1. No metastatic disease in the abdomen or pelvis soft tissues. 2. Single lucent lesion the L2 vertebral body. Unlikely solitary metastasis. Recommend additional follow-up versus MRI or FDG  PET scan if clinical suspicion sufficient for bone metastasis. Electronically Signed   By: SSuzy BouchardM.D.   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  PLAN:    1. 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 PET scan was ordered to complete staging workup, but this was denied by insurance. CT scan results reviewed independently and reported as above with a single lucent lesion at the L2 vertebrae. Return to clinic in 1 week for further evaluation. 2. Right breast mass: Patient will need biopsy to further evaluate. Ultimately, patient will likely require lumpectomy or mastectomy of her right breast as well. 3. 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 The clinical significance of this variant is currently uncertain.  Approximately 30 minutes was spent in discussion of which greater than 50% was consultation.  Patient expressed understanding and was in agreement with this plan. She also understands that She can call clinic at any time with any questions, concerns, or complaints.   Cancer Staging Malignant phyllodes tumor of left breast ([facility] Staging form: Soft Tissue Sarcoma - Unusual Histologies and Sites, AJCC 8th Edition - Clinical stage from 03/18/2017: cN0, cM0, FNCLCC grade: G3 - Signed by TLloyd Huger MD on 03/18/2017   TLloyd Huger MD   03/18/2017 9:21 PM

## 2017-03-12 ENCOUNTER — Inpatient Hospital Stay: Payer: BLUE CROSS/BLUE SHIELD | Attending: Oncology | Admitting: Oncology

## 2017-03-12 ENCOUNTER — Encounter: Payer: Self-pay | Admitting: Radiation Oncology

## 2017-03-12 ENCOUNTER — Encounter (INDEPENDENT_AMBULATORY_CARE_PROVIDER_SITE_OTHER): Payer: Self-pay

## 2017-03-12 ENCOUNTER — Other Ambulatory Visit: Payer: Self-pay | Admitting: *Deleted

## 2017-03-12 ENCOUNTER — Ambulatory Visit
Admission: RE | Admit: 2017-03-12 | Discharge: 2017-03-12 | Disposition: A | Discharge status: Home / Self Care | Payer: BLUE CROSS/BLUE SHIELD | Source: Ambulatory Visit | Attending: Radiation Oncology | Admitting: Radiation Oncology

## 2017-03-12 VITALS — BP 136/84 | HR 102 | Temp 98.0°F | Resp 18 | Ht 64.0 in | Wt 200.9 lb

## 2017-03-12 DIAGNOSIS — Z9012 Acquired absence of left breast and nipple: Secondary | ICD-10-CM | POA: Diagnosis not present

## 2017-03-12 DIAGNOSIS — N6310 Unspecified lump in the right breast, unspecified quadrant: Secondary | ICD-10-CM | POA: Diagnosis not present

## 2017-03-12 DIAGNOSIS — C50212 Malignant neoplasm of upper-inner quadrant of left female breast: Secondary | ICD-10-CM | POA: Diagnosis not present

## 2017-03-12 DIAGNOSIS — Z79899 Other long term (current) drug therapy: Secondary | ICD-10-CM | POA: Insufficient documentation

## 2017-03-12 DIAGNOSIS — C50912 Malignant neoplasm of unspecified site of left female breast: Secondary | ICD-10-CM

## 2017-03-12 DIAGNOSIS — K219 Gastro-esophageal reflux disease without esophagitis: Secondary | ICD-10-CM | POA: Insufficient documentation

## 2017-03-12 DIAGNOSIS — C419 Malignant neoplasm of bone and articular cartilage, unspecified: Secondary | ICD-10-CM | POA: Insufficient documentation

## 2017-03-12 DIAGNOSIS — Z803 Family history of malignant neoplasm of breast: Secondary | ICD-10-CM | POA: Diagnosis not present

## 2017-03-12 DIAGNOSIS — C50412 Malignant neoplasm of upper-outer quadrant of left female breast: Secondary | ICD-10-CM | POA: Diagnosis present

## 2017-03-12 DIAGNOSIS — Z510 Encounter for antineoplastic radiation therapy: Secondary | ICD-10-CM | POA: Insufficient documentation

## 2017-03-12 MED ORDER — CEPHALEXIN 500 MG PO CAPS: 500.0000 mg | ORAL_CAPSULE | Freq: Two times a day (BID) | ORAL | 0 refills | Status: DC

## 2017-03-12 NOTE — Consult Note (Signed)
NEW PATIENT EVALUATION  Name: Kim Barker  MRN: [id number]  Date:   03/12/2017     DOB: 1981-08-16   This 36 y.o. female patient presents to the clinic for initial evaluation of malignant Phyllodes of left breast status post wide local excision.  REFERRING PHYSICIAN: Gennette Pac, FNP  CHIEF COMPLAINT:  Chief Complaint  Patient presents with  . Breast Cancer    Initial Evaluation    DIAGNOSIS: The encounter diagnosis was Malignant phyllodes tumor of left breast ([place]).   PREVIOUS INVESTIGATIONS:  Mammogram and ultrasound reviewed Pathology report reviewed Clinical notes reviewed Case presented at weekly tumor conference  HPI: Patient is a 36 year old female who noted a rapidly increasing left breast mass. She was seen by surgeon who performed biopsy showing  Malignant phyllodes tumor of left breast ([place]). Mammogram and ultrasound confirmed a complex mass in the subareolar left breast at the 10:00 position. She also had multiple bilateral breast masses CONSISTENT with benign fibroadenomas. She underwent a wide local excision of the left breast again showingMalignant phyllodes tumor of left breast . Right breast had 2 biopsies all showing fibroepithelial lesions. Tumor was excised with a 4 mm margin superiorly. There was also a component of osteosarcoma. Tumor measured proximal me 6 cm in greatest dimension. She has done well postoperatively although the breast is quite sore. She's been taking some Aleve no antibiotic therapy. Her case was presented at our weekly tumor conference with recommendation for adjuvant radiation therapy. She is seen today for opinion.Marland Kitchen  PLANNED TREATMENT REGIMEN: Whole breast radiation to the left breast  PAST MEDICAL HISTORY:  has a past medical history of Allergy; Breast mass; Cancer [facility]); GERD (gastroesophageal reflux disease); and Headache.    PAST SURGICAL HISTORY:  Past Surgical History:  Procedure Laterality Date  . BREAST BIOPSY Right 02/20/2017    Procedure: BREAST BIOPSY;  Surgeon: Robert Bellow, MD;  Location: [facility] ORS;  Service: General;  Laterality: Right;  . BREAST LUMPECTOMY Right 36 years old   Benign  . BREAST RECONSTRUCTION WITH MASTOPLASTY Left 02/20/2017   Procedure: BREAST RECONSTRUCTION WITH MASTOPLASTY;  Surgeon: Robert Bellow, MD;  Location: [facility] ORS;  Service: General;  Laterality: Left;  . HYMENPLASTY  36 years old  . MASTECTOMY, PARTIAL Left 02/20/2017   Procedure: MASTECTOMY PARTIAL;  Surgeon: Robert Bellow, MD;  Location: [facility] ORS;  Service: General;  Laterality: Left;  . WISDOM TOOTH EXTRACTION  2004    FAMILY HISTORY: family history includes Asthma in her brother and father; Breast cancer (age of onset: 66) in her maternal aunt, maternal aunt, and maternal aunt; COPD in her paternal grandfather; Diabetes in her father, maternal grandmother, and mother; Heart disease in her paternal grandfather.  SOCIAL HISTORY:  reports that she has never smoked. She has never used smokeless tobacco. She reports that she drinks alcohol. She reports that she does not use drugs.  ALLERGIES: Patient has no known allergies.  MEDICATIONS:  Current Outpatient Prescriptions  Medication Sig Dispense Refill  . diphenhydramine-acetaminophen (TYLENOL PM) 25-500 MG TABS tablet Take 2 tablets by mouth at bedtime as needed (sleep/pain).    Marland Kitchen ibuprofen (ADVIL,MOTRIN) 200 MG tablet Take 800 mg by mouth 2 (two) times daily as needed for headache or moderate pain.     . cephALEXin (KEFLEX) 500 MG capsule Take 1 capsule (500 mg total) by mouth 2 (two) times daily. 14 capsule 0  . HYDROcodone-acetaminophen (NORCO) 5-325 MG tablet Take 1-2 tablets by mouth every 4 (four) hours as needed for  moderate pain. (Patient not taking: Reported on 03/12/2017) 30 tablet 0   No current facility-administered medications for this encounter.     ECOG PERFORMANCE STATUS:  0 - Asymptomatic  REVIEW OF SYSTEMS:  Patient denies any weight loss,  fatigue, weakness, fever, chills or night sweats. Patient denies any loss of vision, blurred vision. Patient denies any ringing  of the ears or hearing loss. No irregular heartbeat. Patient denies heart murmur or history of fainting. Patient denies any chest pain or pain radiating to her upper extremities. Patient denies any shortness of breath, difficulty breathing at night, cough or hemoptysis. Patient denies any swelling in the lower legs. Patient denies any nausea vomiting, vomiting of blood, or coffee ground material in the vomitus. Patient denies any stomach pain. Patient states has had normal bowel movements no significant constipation or diarrhea. Patient denies any dysuria, hematuria or significant nocturia. Patient denies any problems walking, swelling in the joints or loss of balance. Patient denies any skin changes, loss of hair or loss of weight. Patient denies any excessive worrying or anxiety or significant depression. Patient denies any problems with insomnia. Patient denies excessive thirst, polyuria, polydipsia. Patient denies any swollen glands, patient denies easy bruising or easy bleeding. Patient denies any recent infections, allergies or URI. Patient "s visual fields have not changed significantly in recent time.    PHYSICAL EXAM: BP 136/84   Pulse (!) 102   Temp 98 F (36.7 C)   Resp 18   Ht 5\' 4"  (1.626 m)   Wt 200 lb 15.2 oz (91.2 kg)   BMI 34.49 kg/m  Left breast is status post wide local excision. Incision is healing well no evidence of mastitis is noted. No dominant mass or nodularity is noted in either breast in 2 positions examined except for some fibroglandular type masses consistent with known fibroadenomas. No axillary or supraclavicular adenopathy is appreciated. Well-developed well-nourished patient in NAD. HEENT reveals PERLA, EOMI, discs not visualized.  Oral cavity is clear. No oral mucosal lesions are identified. Neck is clear without evidence of cervical or  supraclavicular adenopathy. Lungs are clear to A&P. Cardiac examination is essentially unremarkable with regular rate and rhythm without murmur rub or thrill. Abdomen is benign with no organomegaly or masses noted. Motor sensory and DTR levels are equal and symmetric in the upper and lower extremities. Cranial nerves II through XII are grossly intact. Proprioception is intact. No peripheral adenopathy or edema is identified. No motor or sensory levels are noted. Crude visual fields are within normal range.  LABORATORY DATA: Pathology reports reviewed    RADIOLOGY RESULTS: Mammograms and ultrasound reviewed   IMPRESSION:  Malignant phyllodes tumor of left breast status post wide local excision with close margin and features of osteosarcoma  PLAN: At this time based on the close margin Ligman nature of her lesion and osteosarcoma element would recommend whole breast radiation. Patient is a large breast remaining which would make hypofractionated course of treatment difficult. Would plan on delivering 5040 cGy in 28 fractions boosting her scar another 1600 cGy based on the close margin. Risks and benefits of treatment including skin reaction fatigue alteration of blood counts possible inclusion of superficial lung all were described in detail to the patient. I have personally ordered and scheduled CT simulation for next week. I'm starting him on Keflex 500 mg twice a day for possible mastitis with her persistent pain and tenderness in that breast. Patient Returns my treatment plan well.  I would like to take this  opportunity to thank you for allowing me to participate in the care of your patient.Armstead Peaks., MD

## 2017-03-14 ENCOUNTER — Other Ambulatory Visit: Payer: Self-pay | Admitting: Oncology

## 2017-03-14 DIAGNOSIS — C419 Malignant neoplasm of bone and articular cartilage, unspecified: Secondary | ICD-10-CM

## 2017-03-17 ENCOUNTER — Encounter: Payer: Self-pay | Admitting: General Surgery

## 2017-03-17 ENCOUNTER — Encounter: Admission: RE | Admit: 2017-03-17 | Payer: BLUE CROSS/BLUE SHIELD | Source: Ambulatory Visit

## 2017-03-17 ENCOUNTER — Ambulatory Visit
Admission: RE | Admit: 2017-03-17 | Discharge: 2017-03-17 | Disposition: A | Discharge status: Home / Self Care | Payer: BLUE CROSS/BLUE SHIELD | Source: Ambulatory Visit | Attending: Oncology | Admitting: Oncology

## 2017-03-17 ENCOUNTER — Ambulatory Visit (INDEPENDENT_AMBULATORY_CARE_PROVIDER_SITE_OTHER): Payer: BLUE CROSS/BLUE SHIELD | Admitting: General Surgery

## 2017-03-17 VITALS — BP 124/80 | HR 74 | Resp 12 | Ht 62.0 in | Wt 205.0 lb

## 2017-03-17 DIAGNOSIS — N6310 Unspecified lump in the right breast, unspecified quadrant: Secondary | ICD-10-CM | POA: Insufficient documentation

## 2017-03-17 DIAGNOSIS — N6320 Unspecified lump in the left breast, unspecified quadrant: Secondary | ICD-10-CM | POA: Insufficient documentation

## 2017-03-17 DIAGNOSIS — C419 Malignant neoplasm of bone and articular cartilage, unspecified: Secondary | ICD-10-CM | POA: Insufficient documentation

## 2017-03-17 DIAGNOSIS — M899 Disorder of bone, unspecified: Secondary | ICD-10-CM | POA: Diagnosis not present

## 2017-03-17 DIAGNOSIS — D4860 Neoplasm of uncertain behavior of unspecified breast: Secondary | ICD-10-CM

## 2017-03-17 HISTORY — DX: Malignant neoplasm of unspecified site of left female breast: C50.912

## 2017-03-17 MED ORDER — IOPAMIDOL (ISOVUE-300) INJECTION 61%
100.0000 mL | Freq: Once | INTRAVENOUS | Status: AC | PRN
  Administered 2017-03-17: 100 mL via INTRAVENOUS

## 2017-03-17 NOTE — Progress Notes (Signed)
Patient ID: Malesha Suliman, female   DOB: [birth year], 36 y.o.   MRN: [id number]  Chief Complaint  Patient presents with  . Routine Post Op    HPI Nayellie Sanseverino is a 36 y.o. female here today for her follow up left breast lumpectomy done 02/20/2017. Patient states she is doing well. Ct scan done today.  HPI  Past Medical History:  Diagnosis Date  . Allergy   . Breast cancer, left ([place])    Pt had partial mastectomy 02/20/2017.   . Breast mass    left breast present x 2 months  . GERD (gastroesophageal reflux disease)    PT WAS ON ACID REDUCER BUT HAS NOT TAKEN IN MONTHS  . Headache    MIGRAINES    Past Surgical History:  Procedure Laterality Date  . BREAST BIOPSY Right 02/20/2017   Procedure: BREAST BIOPSY;  Surgeon: Robert Bellow, MD;  Location: [facility] ORS;  Service: General;  Laterality: Right;  . BREAST LUMPECTOMY Right 36 years old   Benign  . BREAST RECONSTRUCTION WITH MASTOPLASTY Left 02/20/2017   Procedure: BREAST RECONSTRUCTION WITH MASTOPLASTY;  Surgeon: Robert Bellow, MD;  Location: [facility] ORS;  Service: General;  Laterality: Left;  . HYMENPLASTY  36 years old  . MASTECTOMY, PARTIAL Left 02/20/2017   Procedure: MASTECTOMY PARTIAL;  Surgeon: Robert Bellow, MD;  Location: [facility] ORS;  Service: General;  Laterality: Left;  . WISDOM TOOTH EXTRACTION  2004    Family History  Problem Relation Age of Onset  . Diabetes Mother   . Asthma Father   . Diabetes Father   . Asthma Brother   . Diabetes Maternal Grandmother   . COPD Paternal Grandfather   . Heart disease Paternal Grandfather   . Breast cancer Maternal Aunt 41  . Breast cancer Maternal Aunt 33  . Breast cancer Maternal Aunt 83    Social History Social History  Substance Use Topics  . Smoking status: Never Smoker  . Smokeless tobacco: Never Used  . Alcohol use Yes     Comment: OCC    No Known Allergies  Current Outpatient Prescriptions  Medication Sig Dispense Refill  . cephALEXin (KEFLEX) 500 MG  capsule Take 1 capsule (500 mg total) by mouth 2 (two) times daily. 14 capsule 0  . diphenhydramine-acetaminophen (TYLENOL PM) 25-500 MG TABS tablet Take 2 tablets by mouth at bedtime as needed (sleep/pain).    Marland Kitchen HYDROcodone-acetaminophen (NORCO) 5-325 MG tablet Take 1-2 tablets by mouth every 4 (four) hours as needed for moderate pain. 30 tablet 0  . ibuprofen (ADVIL,MOTRIN) 200 MG tablet Take 800 mg by mouth 2 (two) times daily as needed for headache or moderate pain.      No current facility-administered medications for this visit.     Review of Systems Review of Systems  Constitutional: Negative.   Respiratory: Negative.   Cardiovascular: Negative.     Blood pressure 124/80, pulse 74, resp. rate 12, height 5\' 2"  (1.575 m), weight 205 lb (93 kg), last menstrual period 03/04/2017.  Physical Exam Physical Exam  Constitutional: She is oriented to person, place, and time. She appears well-developed and well-nourished.  Pulmonary/Chest: Right breast exhibits no inverted nipple, no mass, no nipple discharge, no skin change and no tenderness. Left breast exhibits no inverted nipple, no mass, no nipple discharge, no skin change and no tenderness.  Lymphadenopathy:    She has no cervical adenopathy.    She has no axillary adenopathy.  Neurological: She is alert and oriented to person,  place, and time.  Skin: Skin is warm and dry.    Data Reviewed The patient's case had been presented at the 03/13/2017 [facility] tumor board. 2 primary recommendations: 1 removal of the fibroepithelial tumors from the right breast and complete excision of the fibroadenoma from the left breast, all sampled by core biopsy prior to surgery to confirm no component of phyllodes was overlooked. I Moore contentious recommendation was proceeding directly to mastectomy based on the potential recurrence rate of up to 60% with wide surgical excision and radiation therapy. The anterior, skin, margin was only 4 mm, but other  margins were well over a centimeter. I am not convinced that the patient's risk of local recurrence is as high as the medical oncologists are predicting, but the discussion was related to the patient.  CT of the chest abdomen and pelvis had been obtained earlier in the day. IMPRESSION:  1. Bilateral breast nodules and masses. 2. No axillary adenopathy.  No intrathoracic adenopathy. 3. No pulmonary nodules.  Abdomen / Pelvis Impression:  1. No metastatic disease in the abdomen or pelvis soft tissues. 2. Single lucent lesion the L2 vertebral body. Unlikely solitary metastasis. Recommend additional follow-up versus MRI or FDG PET scan if clinical suspicion sufficient for bone metastasis.  Review of these images did confirm that each of the suspected fibroadenomas in the breast have been biopsied with clips located within each lesion. 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     Plan    The patient is scheduled to meet with radiation oncology in the near future for their assessment and recommendations regarding left breast management.     Discussed treatment plan with patient. Patient to call for excision right breast . Follow up appointment to be announced.   HPI, Physical Exam, Assessment and Plan have been scribed under the direction and in the presence of Hervey Ard, MD.  Gaspar Cola, CMA   I have completed the exam and reviewed the above documentation for accuracy and completeness.  I agree with the above.  Haematologist has been used and any errors in dictation or transcription are unintentional.  Hervey Ard, M.D., F.A.C.S.  Robert Bellow 03/18/2017, 7:58 AM

## 2017-03-17 NOTE — Patient Instructions (Signed)
Follow up appointment to be announced.  

## 2017-03-18 ENCOUNTER — Other Ambulatory Visit: Payer: Self-pay | Admitting: *Deleted

## 2017-03-18 LAB — SURGICAL PATHOLOGY

## 2017-03-18 MED ORDER — FLUCONAZOLE 100 MG PO TABS: 100.0000 mg | ORAL_TABLET | Freq: Every day | ORAL | 0 refills | Status: DC

## 2017-03-18 NOTE — Progress Notes (Signed)
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  INTERVAL HISTORY: Patient returns to clinic today for further evaluation, discussion of her imaging results, and additional treatment planning. She tolerated her lumpectomy well without significant side effects. She currently feels well and is asymptomatic. She has no neurologic complaints. She denies any recent fevers or illnesses. She has a good appetite and denies weight loss. She has no chest pain or shortness of breath. She denies any nausea, vomiting, constipation, or diarrhea. She has no urinary complaints. Patient offers no further specific complaints.  REVIEW OF SYSTEMS:   Review of Systems  Constitutional: Negative.  Negative for fever, malaise/fatigue and weight loss.  Respiratory: Negative.  Negative for cough and shortness of breath.   Cardiovascular: Negative.  Negative for chest pain and leg swelling.  Gastrointestinal: Negative.  Negative for abdominal pain.  Genitourinary: Negative.   Musculoskeletal: Negative.   Neurological: Negative.  Negative for sensory change and weakness.  Psychiatric/Behavioral: The patient is nervous/anxious.     As per HPI. Otherwise, a complete review of systems is negative.  PAST MEDICAL HISTORY: Past Medical History:  Diagnosis Date  . Allergy   . Breast cancer, left ([place])    Pt had partial mastectomy 02/20/2017.   . Breast mass    left breast present x 2 months  . GERD (gastroesophageal reflux disease)    PT WAS ON ACID REDUCER BUT HAS NOT TAKEN IN MONTHS  . Headache    MIGRAINES    PAST SURGICAL HISTORY: Past Surgical  History:  Procedure Laterality Date  . BREAST BIOPSY Right 02/20/2017   Procedure: BREAST BIOPSY;  Surgeon: Robert Bellow, MD;  Location: [facility] ORS;  Service: General;  Laterality: Right;  . BREAST LUMPECTOMY Right 36 years old   Benign  . BREAST RECONSTRUCTION WITH MASTOPLASTY Left 02/20/2017   Procedure: BREAST RECONSTRUCTION WITH MASTOPLASTY;  Surgeon: Robert Bellow, MD;  Location: [facility] ORS;  Service: General;  Laterality: Left;  . HYMENPLASTY  36 years old  . MASTECTOMY, PARTIAL Left 02/20/2017   Procedure: MASTECTOMY PARTIAL;  Surgeon: Robert Bellow, MD;  Location: [facility] ORS;  Service: General;  Laterality: Left;  . WISDOM TOOTH EXTRACTION  2004    FAMILY HISTORY: Family History  Problem Relation Age of Onset  . Diabetes Mother   . Asthma Father   . Diabetes Father   . Asthma Brother   . Diabetes Maternal Grandmother   . COPD Paternal Grandfather   . Heart disease Paternal Grandfather   . Breast cancer Maternal Aunt 45  . Breast cancer Maternal Aunt 4  . Breast cancer Maternal Aunt 40    ADVANCED DIRECTIVES (Y/N):  N  HEALTH MAINTENANCE: Social History  Substance Use Topics  . Smoking status: Never Smoker  . Smokeless tobacco: Never Used  . Alcohol use Yes     Comment: OCC     Colonoscopy:  PAP:  Bone density:  Lipid panel:  No Known Allergies  Current Outpatient Prescriptions  Medication Sig Dispense Refill  . diphenhydramine-acetaminophen (TYLENOL PM) 25-500 MG TABS tablet Take 2 tablets by mouth at bedtime as needed (sleep/pain).    . fluconazole (DIFLUCAN) 100 MG tablet Take 1 tablet (  100 mg total) by mouth daily. 7 tablet 0  . ibuprofen (ADVIL,MOTRIN) 200 MG tablet Take 800 mg by mouth 2 (two) times daily as needed for headache or moderate pain.     . cephALEXin (KEFLEX) 500 MG capsule Take 1 capsule (500 mg total) by mouth 2 (two) times daily. (Patient not taking: Reported on 03/19/2017) 14 capsule 0  . HYDROcodone-acetaminophen (NORCO) 5-325 MG  tablet Take 1-2 tablets by mouth every 4 (four) hours as needed for moderate pain. (Patient not taking: Reported on 03/19/2017) 30 tablet 0   No current facility-administered medications for this visit.     OBJECTIVE: Vitals:   03/19/17 1447  BP: 118/84  Pulse: 86  Temp: 98.8 F (37.1 C)     Body mass index is 37.46 kg/m.    ECOG FS:0 - Asymptomatic  General: Well-developed, well-nourished, no acute distress. Eyes: Pink conjunctiva, anicteric sclera. Breasts: Well-healed surgical scar left breast. Right breast mass is also evident. Lungs: Clear to auscultation bilaterally. Heart: Regular rate and rhythm. No rubs, murmurs, or gallops. Abdomen: Soft, nontender, nondistended. No organomegaly noted, normoactive bowel sounds. Musculoskeletal: No edema, cyanosis, or clubbing. Neuro: Alert, answering all questions appropriately. Cranial nerves grossly intact. Skin: No rashes or petechiae noted. Psych: Normal affect. Lymphatics: No cervical, calvicular, axillary or inguinal LAD.   LAB RESULTS:  Lab Results  Component Value Date   NA 137 02/05/2014   K 3.7 02/05/2014   CL 106 02/05/2014   CO2 27 02/05/2014   GLUCOSE 66 02/05/2014   BUN 8 02/05/2014   CREATININE 0.80 02/05/2014   CALCIUM 8.7 02/05/2014   PROT 7.7 02/05/2014   ALBUMIN 3.6 02/05/2014   AST 16 02/05/2014   ALT 11 (L) 02/05/2014   ALKPHOS 49 02/05/2014   BILITOT 0.4 02/05/2014   GFRNONAA >60 02/05/2014   GFRAA >60 02/05/2014    Lab Results  Component Value Date   WBC 7.0 02/05/2014   HGB 12.3 02/05/2014   HCT 36.3 02/05/2014   MCV 88 02/05/2014   PLT 252 02/05/2014     STUDIES: Ct Chest W Contrast  Result Date: 03/17/2017 CLINICAL DATA:  LEFT breast cancer with partial mastectomy. Mastectomy 02/20/2017. EXAM: CT CHEST, ABDOMEN, AND PELVIS WITH CONTRAST TECHNIQUE: Multidetector CT imaging of the chest, abdomen and pelvis was performed following the standard protocol during bolus administration of  intravenous contrast. CONTRAST:  148m ISOVUE-300 IOPAMIDOL (ISOVUE-300) INJECTION 61% COMPARISON:  None. FINDINGS: CT CHEST FINDINGS Cardiovascular: No significant vascular findings. Normal heart size. No pericardial effusion. Mediastinum/Nodes: No axillary adenopathy. Bilateral rounded breast lesions. Largest lesion in the RIGHT breast measures 4.2 cm. Multiple surgical biopsy clips noted. No supraclavicular adenopathy. No internal mammary adenopathy. No mediastinal hilar adenopathy. No pericardial fluid. Esophagus normal. Lungs/Pleura: No suspicious pulmonary nodules. Airways normal. No pleural nodularity. Musculoskeletal: No aggressive osseous lesion. CT ABDOMEN AND PELVIS FINDINGS Hepatobiliary:  No focal hepatic lesion.  Normal gallbladder. Pancreas: Pancreas is normal. No ductal dilatation. No pancreatic inflammation. Spleen: Normal spleen Adrenals/urinary tract: Adrenal glands and kidneys are normal. The ureters and bladder normal. Stomach/Bowel: Stomach, small bowel, appendix, and cecum are normal. The colon and rectosigmoid colon are normal. Vascular/Lymphatic: Abdominal aorta is normal caliber. There is no retroperitoneal or periportal lymphadenopathy. No pelvic lymphadenopathy. Reproductive: Uterus and ovaries normal. Other: No peritoneal nodularity no omental thickening. Musculoskeletal: Low-density lesion measuring 6 mm within the L2 vertebral body (image 65, series 2). IMPRESSION: Chest Impression: 1. Bilateral breast nodules and masses. 2. No axillary adenopathy.  No intrathoracic adenopathy.  3. No pulmonary nodules. Abdomen / Pelvis Impression: 1. No metastatic disease in the abdomen or pelvis soft tissues. 2. Single lucent lesion the L2 vertebral body. Unlikely solitary metastasis. Recommend additional follow-up versus MRI or FDG PET scan if clinical suspicion sufficient for bone metastasis. Electronically Signed   By: Suzy Bouchard M.D.   On: 03/17/2017 17:47   Ct Abdomen Pelvis W  Contrast  Result Date: 03/17/2017 CLINICAL DATA:  LEFT breast cancer with partial mastectomy. Mastectomy 02/20/2017. EXAM: CT CHEST, ABDOMEN, AND PELVIS WITH CONTRAST TECHNIQUE: Multidetector CT imaging of the chest, abdomen and pelvis was performed following the standard protocol during bolus administration of intravenous contrast. CONTRAST:  173m ISOVUE-300 IOPAMIDOL (ISOVUE-300) INJECTION 61% COMPARISON:  None. FINDINGS: CT CHEST FINDINGS Cardiovascular: No significant vascular findings. Normal heart size. No pericardial effusion. Mediastinum/Nodes: No axillary adenopathy. Bilateral rounded breast lesions. Largest lesion in the RIGHT breast measures 4.2 cm. Multiple surgical biopsy clips noted. No supraclavicular adenopathy. No internal mammary adenopathy. No mediastinal hilar adenopathy. No pericardial fluid. Esophagus normal. Lungs/Pleura: No suspicious pulmonary nodules. Airways normal. No pleural nodularity. Musculoskeletal: No aggressive osseous lesion. CT ABDOMEN AND PELVIS FINDINGS Hepatobiliary:  No focal hepatic lesion.  Normal gallbladder. Pancreas: Pancreas is normal. No ductal dilatation. No pancreatic inflammation. Spleen: Normal spleen Adrenals/urinary tract: Adrenal glands and kidneys are normal. The ureters and bladder normal. Stomach/Bowel: Stomach, small bowel, appendix, and cecum are normal. The colon and rectosigmoid colon are normal. Vascular/Lymphatic: Abdominal aorta is normal caliber. There is no retroperitoneal or periportal lymphadenopathy. No pelvic lymphadenopathy. Reproductive: Uterus and ovaries normal. Other: No peritoneal nodularity no omental thickening. Musculoskeletal: Low-density lesion measuring 6 mm within the L2 vertebral body (image 65, series 2). IMPRESSION: Chest Impression: 1. Bilateral breast nodules and masses. 2. No axillary adenopathy.  No intrathoracic adenopathy. 3. No pulmonary nodules. Abdomen / Pelvis Impression: 1. No metastatic disease in the abdomen or  pelvis soft tissues. 2. Single lucent lesion the L2 vertebral body. Unlikely solitary metastasis. Recommend additional follow-up versus MRI or FDG PET scan if clinical suspicion sufficient for bone metastasis. Electronically Signed   By: SSuzy BouchardM.D.   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  PLAN:    1. 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 CT scan results reviewed independently and reported as above with a single lucent lesion in her L2 vertebrae. Although this is unlikely a solitary metastasis per radiology, have ordered a bone scan for completeness. Patient had consultation with radiation oncology today, but is unlikely XRT will be beneficial in this scenario. She has follow-up with surgery to further discuss a full mastectomy as well as right breast surgery[place] 2. Right breast mass: Ultimately, patient will likely require lumpectomy or mastectomy of her right breast as well. 3. 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 The clinical significance of this variant is currently uncertain.  Approximately 30 minutes was spent in discussion of which greater than 50% was consultation.  Patient expressed understanding and was in agreement with this plan. She also understands that She can call clinic at any time with any questions, concerns, or complaints.   Cancer Staging Malignant phyllodes tumor of left breast (  [place]) Staging form: Soft Tissue Sarcoma - Unusual Histologies and Sites, AJCC 8th Edition - Clinical stage from 03/18/2017: cN0, cM0, FNCLCC grade: G3 - Signed by Lloyd Huger, MD on  03/19/2017   Lloyd Huger, MD   03/22/2017 8:04 AM

## 2017-03-19 ENCOUNTER — Ambulatory Visit: Payer: BLUE CROSS/BLUE SHIELD

## 2017-03-19 ENCOUNTER — Inpatient Hospital Stay (HOSPITAL_BASED_OUTPATIENT_CLINIC_OR_DEPARTMENT_OTHER): Payer: BLUE CROSS/BLUE SHIELD | Admitting: Oncology

## 2017-03-19 ENCOUNTER — Ambulatory Visit
Admission: RE | Admit: 2017-03-19 | Discharge: 2017-03-19 | Disposition: A | Discharge status: Home / Self Care | Payer: BLUE CROSS/BLUE SHIELD | Source: Ambulatory Visit | Attending: Radiation Oncology | Admitting: Radiation Oncology

## 2017-03-19 VITALS — BP 118/84 | HR 86 | Temp 98.8°F | Wt 204.8 lb

## 2017-03-19 DIAGNOSIS — Z79899 Other long term (current) drug therapy: Secondary | ICD-10-CM | POA: Diagnosis not present

## 2017-03-19 DIAGNOSIS — C50412 Malignant neoplasm of upper-outer quadrant of left female breast: Secondary | ICD-10-CM | POA: Diagnosis not present

## 2017-03-19 DIAGNOSIS — N6310 Unspecified lump in the right breast, unspecified quadrant: Secondary | ICD-10-CM

## 2017-03-19 DIAGNOSIS — Z9012 Acquired absence of left breast and nipple: Secondary | ICD-10-CM | POA: Diagnosis not present

## 2017-03-19 DIAGNOSIS — C419 Malignant neoplasm of bone and articular cartilage, unspecified: Secondary | ICD-10-CM | POA: Diagnosis not present

## 2017-03-19 DIAGNOSIS — C50912 Malignant neoplasm of unspecified site of left female breast: Secondary | ICD-10-CM

## 2017-03-19 NOTE — Progress Notes (Signed)
Patient c/o intermittent left breast pain with activity, patient states left breast pain is 5 oof 10. Vitals stable and documented

## 2017-03-21 ENCOUNTER — Other Ambulatory Visit: Payer: Self-pay | Admitting: *Deleted

## 2017-03-21 DIAGNOSIS — C50912 Malignant neoplasm of unspecified site of left female breast: Secondary | ICD-10-CM

## 2017-03-24 DIAGNOSIS — C50412 Malignant neoplasm of upper-outer quadrant of left female breast: Secondary | ICD-10-CM | POA: Diagnosis not present

## 2017-03-26 ENCOUNTER — Ambulatory Visit: Admission: RE | Admit: 2017-03-26 | Payer: BLUE CROSS/BLUE SHIELD | Source: Ambulatory Visit

## 2017-03-26 ENCOUNTER — Encounter
Admission: RE | Admit: 2017-03-26 | Discharge: 2017-03-26 | Disposition: A | Discharge status: Home / Self Care | Payer: BLUE CROSS/BLUE SHIELD | Source: Ambulatory Visit | Attending: Oncology | Admitting: Oncology

## 2017-03-26 ENCOUNTER — Ambulatory Visit
Admission: RE | Admit: 2017-03-26 | Discharge: 2017-03-26 | Disposition: A | Discharge status: Home / Self Care | Payer: BLUE CROSS/BLUE SHIELD | Source: Ambulatory Visit | Attending: Oncology | Admitting: Oncology

## 2017-03-26 DIAGNOSIS — M899 Disorder of bone, unspecified: Secondary | ICD-10-CM | POA: Insufficient documentation

## 2017-03-26 DIAGNOSIS — C50912 Malignant neoplasm of unspecified site of left female breast: Secondary | ICD-10-CM

## 2017-03-26 MED ORDER — TECHNETIUM TC 99M MEDRONATE IV KIT
25.0000 | PACK | Freq: Once | INTRAVENOUS | Status: AC | PRN
  Administered 2017-03-26: 21.159 via INTRAVENOUS

## 2017-03-27 ENCOUNTER — Ambulatory Visit: Payer: BLUE CROSS/BLUE SHIELD

## 2017-03-28 ENCOUNTER — Ambulatory Visit: Payer: BLUE CROSS/BLUE SHIELD

## 2017-03-28 ENCOUNTER — Other Ambulatory Visit: Payer: Self-pay | Admitting: *Deleted

## 2017-03-28 DIAGNOSIS — C50912 Malignant neoplasm of unspecified site of left female breast: Secondary | ICD-10-CM

## 2017-03-31 ENCOUNTER — Ambulatory Visit: Payer: BLUE CROSS/BLUE SHIELD

## 2017-04-01 ENCOUNTER — Ambulatory Visit: Payer: BLUE CROSS/BLUE SHIELD

## 2017-04-02 ENCOUNTER — Inpatient Hospital Stay: Payer: BLUE CROSS/BLUE SHIELD | Attending: Oncology | Admitting: Oncology

## 2017-04-02 ENCOUNTER — Ambulatory Visit: Payer: BLUE CROSS/BLUE SHIELD

## 2017-04-02 ENCOUNTER — Encounter: Payer: Self-pay | Admitting: General Surgery

## 2017-04-02 ENCOUNTER — Telehealth: Payer: Self-pay

## 2017-04-02 ENCOUNTER — Ambulatory Visit (INDEPENDENT_AMBULATORY_CARE_PROVIDER_SITE_OTHER): Payer: BLUE CROSS/BLUE SHIELD | Admitting: General Surgery

## 2017-04-02 VITALS — BP 124/78 | HR 74 | Ht 64.0 in | Wt 204.0 lb

## 2017-04-02 VITALS — BP 134/90 | HR 96 | Temp 97.7°F | Resp 20 | Wt 204.3 lb

## 2017-04-02 DIAGNOSIS — K219 Gastro-esophageal reflux disease without esophagitis: Secondary | ICD-10-CM | POA: Diagnosis not present

## 2017-04-02 DIAGNOSIS — Z9012 Acquired absence of left breast and nipple: Secondary | ICD-10-CM

## 2017-04-02 DIAGNOSIS — C419 Malignant neoplasm of bone and articular cartilage, unspecified: Secondary | ICD-10-CM

## 2017-04-02 DIAGNOSIS — Z79899 Other long term (current) drug therapy: Secondary | ICD-10-CM | POA: Diagnosis not present

## 2017-04-02 DIAGNOSIS — C50912 Malignant neoplasm of unspecified site of left female breast: Secondary | ICD-10-CM

## 2017-04-02 DIAGNOSIS — N6310 Unspecified lump in the right breast, unspecified quadrant: Secondary | ICD-10-CM

## 2017-04-02 NOTE — Patient Instructions (Signed)
Patient to call back soon as she makes a decision.   The patient is aware to call back for any questions or concerns.

## 2017-04-02 NOTE — Telephone Encounter (Signed)
-----   Message from Robert Bellow, MD sent at 04/02/2017  8:58 AM EDT ----- We need to get the patient in for a follow up visit soon. Thanks. Need to review options for management.

## 2017-04-02 NOTE — Progress Notes (Signed)
Patient ID: Kim Barker, female   DOB: [birth year], 36 y.o.   MRN: [id number]  Chief Complaint  Patient presents with  . Other    HPI Kim Barker is a 36 y.o. female here today to discuss breast cancer options.  HPI  Past Medical History:  Diagnosis Date  . Allergy   . Breast cancer, left ([place])    Pt had partial mastectomy 02/20/2017. 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 Breast mass    left breast present x 2 months  . GERD (gastroesophageal reflux disease)    PT WAS ON ACID REDUCER BUT HAS NOT TAKEN IN MONTHS  . Headache    MIGRAINES    Past Surgical History:  Procedure Laterality Date  . BREAST BIOPSY Right 02/20/2017   Procedure: BREAST BIOPSY;  Surgeon: Robert Bellow, MD;  Location: [facility] ORS;  Service: General;  Laterality: Right;  . BREAST LUMPECTOMY Right 36 years old   Benign  . BREAST RECONSTRUCTION WITH MASTOPLASTY Left 02/20/2017   Procedure: BREAST RECONSTRUCTION WITH MASTOPLASTY;  Surgeon: Robert Bellow, MD;  Location: [facility] ORS;  Service: General;  Laterality: Left;  . HYMENPLASTY  36 years old  . MASTECTOMY, PARTIAL Left 02/20/2017   Procedure: MASTECTOMY PARTIAL;  Surgeon: Robert Bellow, MD;  Location: [facility] ORS;  Service: General;  Laterality: Left;  . WISDOM TOOTH EXTRACTION  2004    Family History  Problem Relation Age of Onset  . Diabetes Mother   . Asthma Father   . Diabetes Father   . Asthma Brother   . Diabetes Maternal Grandmother   . COPD Paternal Grandfather   . Heart disease Paternal Grandfather   . Breast cancer Maternal Aunt 39  . Breast cancer Maternal Aunt 31  . Breast cancer Maternal Aunt 63    Social History Social History  Substance Use Topics  . Smoking status: Never Smoker  . Smokeless tobacco: Never Used  . Alcohol use Yes     Comment: OCC    No Known Allergies  Current Outpatient Prescriptions  Medication Sig Dispense Refill  . cephALEXin (KEFLEX) 500 MG capsule  Take 1 capsule (500 mg total) by mouth 2 (two) times daily. 14 capsule 0  . diphenhydramine-acetaminophen (TYLENOL PM) 25-500 MG TABS tablet Take 2 tablets by mouth at bedtime as needed (sleep/pain).    . fluconazole (DIFLUCAN) 100 MG tablet Take 1 tablet (100 mg total) by mouth daily. 7 tablet 0  . HYDROcodone-acetaminophen (NORCO) 5-325 MG tablet Take 1-2 tablets by mouth every 4 (four) hours as needed for moderate pain. 30 tablet 0  . ibuprofen (ADVIL,MOTRIN) 200 MG tablet Take 800 mg by mouth 2 (two) times daily as needed for headache or moderate pain.     . Melatonin 5 MG CAPS Take by mouth. 1 at hs for sleep     No current facility-administered medications for this visit.     Review of Systems Review of Systems  Constitutional: Negative.   Respiratory: Negative.   Cardiovascular: Negative.     Blood pressure 124/78, pulse 74, height 5\' 4"  (1.626 m), weight 204 lb (92.5 kg), last menstrual period 03/04/2017.  Physical Exam Physical Exam Deferred  Data Reviewed Medical oncology note of 04/02/2017 reviewed.  Assessment    Malignant follow 80s tumor of the left breast, multiple suspected fibroadenomas.    Plan    While the medical oncology service has strongly recommended the patient consider mastectomy, there is no guarantee that this  will completely eliminate her risk for recurrent disease any better than proceeding with chest wall radiation and careful clinical observation.  The patient is not strongly and she sit in an implant reconstruction. I'm not sure her body habitus would lend itself to autologous tissue transfer.  At this point, I think the best course would be to have her evaluated by the plastic surgery service for options for reconstruction if mastectomy was chosen.  In consultation with Dr. Baruch Gouty from radiation oncology, I am not opposed to completing whole breast radiation and proceeding with observation. The likelihood of recurrent disease is as great  systemically from hematogenous metastasis as any other source. While her margin on the skin was less than a centimeter, this was because there was only skin at this level. Should she develop a cutaneous recurrences would be easily identified.  Patient is open to being evaluated by plastic surgery and this will be scheduled with Audelia Hives, D.O. in [place].   Patient to call back soon as she makes a decision.   The patient is aware to call back for any questions or concerns.  HPI, Physical Exam, Assessment and Plan have been scribed under the direction and in the presence of Hervey Ard, MD.  Gaspar Cola, CMA  I have completed the exam and reviewed the above documentation for accuracy and completeness.  I agree with the above.  Haematologist has been used and any errors in dictation or transcription are unintentional.  Hervey Ard, M.D., F.A.C.S.  Robert Bellow 04/05/2017, 4:37 PM

## 2017-04-02 NOTE — Progress Notes (Signed)
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  INTERVAL HISTORY: Patient returns to clinic today for further evaluation, discussion of her imaging results, and additional treatment planning. She currently feels well and is asymptomatic. She has no neurologic complaints. She denies any recent fevers or illnesses. She has a good appetite and denies weight loss. She has no chest pain or shortness of breath. She denies any nausea, vomiting, constipation, or diarrhea. She has no urinary complaints. Patient offers no specific complaints today.  REVIEW OF SYSTEMS:   Review of Systems  Constitutional: Negative.  Negative for fever, malaise/fatigue and weight loss.  Respiratory: Negative.  Negative for cough and shortness of breath.   Cardiovascular: Negative.  Negative for chest pain and leg swelling.  Gastrointestinal: Negative.  Negative for abdominal pain.  Genitourinary: Negative.   Musculoskeletal: Negative.   Neurological: Negative.  Negative for sensory change and weakness.  Psychiatric/Behavioral: The patient is nervous/anxious.     As per HPI. Otherwise, a complete review of systems is negative.  PAST MEDICAL HISTORY: Past Medical History:  Diagnosis Date  . Allergy   . Breast cancer, left ([place])    Pt had partial mastectomy 02/20/2017. 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 Breast mass    left breast present x 2 months  . GERD (gastroesophageal reflux disease)    PT WAS ON ACID REDUCER BUT HAS NOT TAKEN IN MONTHS  . Headache    MIGRAINES    PAST SURGICAL HISTORY: Past  Surgical History:  Procedure Laterality Date  . BREAST BIOPSY Right 02/20/2017   Procedure: BREAST BIOPSY;  Surgeon: Robert Bellow, MD;  Location: [facility] ORS;  Service: General;  Laterality: Right;  . BREAST LUMPECTOMY Right 36 years old   Benign  . BREAST RECONSTRUCTION WITH MASTOPLASTY Left 02/20/2017   Procedure: BREAST RECONSTRUCTION WITH MASTOPLASTY;  Surgeon: Robert Bellow, MD;  Location: [facility] ORS;  Service: General;  Laterality: Left;  . HYMENPLASTY  36 years old  . MASTECTOMY, PARTIAL Left 02/20/2017   Procedure: MASTECTOMY PARTIAL;  Surgeon: Robert Bellow, MD;  Location: [facility] ORS;  Service: General;  Laterality: Left;  . WISDOM TOOTH EXTRACTION  2004    FAMILY HISTORY: Family History  Problem Relation Age of Onset  . Diabetes Mother   . Asthma Father   . Diabetes Father   . Asthma Brother   . Diabetes Maternal Grandmother   . COPD Paternal Grandfather   . Heart disease Paternal Grandfather   . Breast cancer Maternal Aunt 53  . Breast cancer Maternal Aunt 65  . Breast cancer Maternal Aunt 40    ADVANCED DIRECTIVES (Y/N):  N  HEALTH MAINTENANCE: Social History  Substance Use Topics  . Smoking status: Never Smoker  . Smokeless tobacco: Never Used  . Alcohol use Yes     Comment: OCC     Colonoscopy:  PAP:  Bone density:  Lipid panel:  No Known Allergies  Current Outpatient Prescriptions  Medication Sig Dispense Refill  . diphenhydramine-acetaminophen (TYLENOL PM) 25-500 MG TABS tablet Take 2 tablets by mouth at bedtime as needed (sleep/pain).    Marland Kitchen HYDROcodone-acetaminophen (NORCO) 5-325 MG tablet Take  1-2 tablets by mouth every 4 (four) hours as needed for moderate pain. 30 tablet 0  . ibuprofen (ADVIL,MOTRIN) 200 MG tablet Take 800 mg by mouth 2 (two) times daily as needed for headache or moderate pain.     . Melatonin 5 MG CAPS Take by mouth. 1 at hs for sleep    . cephALEXin (KEFLEX) 500 MG capsule Take 1 capsule (500 mg total) by mouth 2 (two)  times daily. 14 capsule 0  . fluconazole (DIFLUCAN) 100 MG tablet Take 1 tablet (100 mg total) by mouth daily. 7 tablet 0   No current facility-administered medications for this visit.     OBJECTIVE: Vitals:   04/02/17 1508  BP: 134/90  Pulse: 96  Resp: 20  Temp: 97.7 F (36.5 C)     Body mass index is 37.37 kg/m.    ECOG FS:0 - Asymptomatic  General: Well-developed, well-nourished, no acute distress. Eyes: Pink conjunctiva, anicteric sclera. Breasts: Well-healed surgical scar left breast. Right breast not examined today. Lungs: Clear to auscultation bilaterally. Heart: Regular rate and rhythm. No rubs, murmurs, or gallops. Abdomen: Soft, nontender, nondistended. No organomegaly noted, normoactive bowel sounds. Musculoskeletal: No edema, cyanosis, or clubbing. Neuro: Alert, answering all questions appropriately. Cranial nerves grossly intact. Skin: No rashes or petechiae noted. Psych: Normal affect.   LAB RESULTS:  Lab Results  Component Value Date   NA 137 02/05/2014   K 3.7 02/05/2014   CL 106 02/05/2014   CO2 27 02/05/2014   GLUCOSE 66 02/05/2014   BUN 8 02/05/2014   CREATININE 0.80 02/05/2014   CALCIUM 8.7 02/05/2014   PROT 7.7 02/05/2014   ALBUMIN 3.6 02/05/2014   AST 16 02/05/2014   ALT 11 (L) 02/05/2014   ALKPHOS 49 02/05/2014   BILITOT 0.4 02/05/2014   GFRNONAA >60 02/05/2014   GFRAA >60 02/05/2014    Lab Results  Component Value Date   WBC 7.0 02/05/2014   HGB 12.3 02/05/2014   HCT 36.3 02/05/2014   MCV 88 02/05/2014   PLT 252 02/05/2014     STUDIES: Ct Chest W Contrast  Result Date: 03/17/2017 CLINICAL DATA:  LEFT breast cancer with partial mastectomy. Mastectomy 02/20/2017. EXAM: CT CHEST, ABDOMEN, AND PELVIS WITH CONTRAST TECHNIQUE: Multidetector CT imaging of the chest, abdomen and pelvis was performed following the standard protocol during bolus administration of intravenous contrast. CONTRAST:  133m ISOVUE-300 IOPAMIDOL (ISOVUE-300)  INJECTION 61% COMPARISON:  None. FINDINGS: CT CHEST FINDINGS Cardiovascular: No significant vascular findings. Normal heart size. No pericardial effusion. Mediastinum/Nodes: No axillary adenopathy. Bilateral rounded breast lesions. Largest lesion in the RIGHT breast measures 4.2 cm. Multiple surgical biopsy clips noted. No supraclavicular adenopathy. No internal mammary adenopathy. No mediastinal hilar adenopathy. No pericardial fluid. Esophagus normal. Lungs/Pleura: No suspicious pulmonary nodules. Airways normal. No pleural nodularity. Musculoskeletal: No aggressive osseous lesion. CT ABDOMEN AND PELVIS FINDINGS Hepatobiliary:  No focal hepatic lesion.  Normal gallbladder. Pancreas: Pancreas is normal. No ductal dilatation. No pancreatic inflammation. Spleen: Normal spleen Adrenals/urinary tract: Adrenal glands and kidneys are normal. The ureters and bladder normal. Stomach/Bowel: Stomach, small bowel, appendix, and cecum are normal. The colon and rectosigmoid colon are normal. Vascular/Lymphatic: Abdominal aorta is normal caliber. There is no retroperitoneal or periportal lymphadenopathy. No pelvic lymphadenopathy. Reproductive: Uterus and ovaries normal. Other: No peritoneal nodularity no omental thickening. Musculoskeletal: Low-density lesion measuring 6 mm within the L2 vertebral body (image 65, series 2). IMPRESSION: Chest Impression: 1. Bilateral breast nodules and masses. 2. No axillary adenopathy.  No intrathoracic adenopathy.  3. No pulmonary nodules. Abdomen / Pelvis Impression: 1. No metastatic disease in the abdomen or pelvis soft tissues. 2. Single lucent lesion the L2 vertebral body. Unlikely solitary metastasis. Recommend additional follow-up versus MRI or FDG PET scan if clinical suspicion sufficient for bone metastasis. Electronically Signed   By: Genevive Bi M.D.   On: 03/17/2017 17:47   Nm Bone Scan Whole Body  Result Date: 03/26/2017 CLINICAL DATA:  Malignant tumor left breast. EXAM:  NUCLEAR MEDICINE WHOLE BODY BONE SCAN TECHNIQUE: Whole body anterior and posterior images were obtained approximately 3 hours after intravenous injection of radiopharmaceutical. RADIOPHARMACEUTICALS:  21.2 MCi Technetium-2m MDP IV COMPARISON:  CT 03/17/2017. FINDINGS: Bilateral renal function excretion. Increased activity noted over both SI joints. Sacroiliitis cannot be excluded. No focal bony abnormalities noted to suggest metastatic disease. IMPRESSION: 1. Increased activity noted over both SI joints. Sacroiliitis cannot be excluded. 2.  No focal abnormality.  No evidence of metastatic disease. Electronically Signed   By: Maisie Fus  Register   On: 03/26/2017 14:46   Ct Abdomen Pelvis W Contrast  Result Date: 03/17/2017 CLINICAL DATA:  LEFT breast cancer with partial mastectomy. Mastectomy 02/20/2017. EXAM: CT CHEST, ABDOMEN, AND PELVIS WITH CONTRAST TECHNIQUE: Multidetector CT imaging of the chest, abdomen and pelvis was performed following the standard protocol during bolus administration of intravenous contrast. CONTRAST:  ISOVUE-300 IOPAMIDOL (ISOVUE-300) INJECTION 61% COMPARISON:  None. FINDINGS: CT CHEST FINDINGS Cardiovascular: No significant vascular findings. Normal heart size. No pericardial effusion. Mediastinum/Nodes: No axillary adenopathy. Bilateral rounded breast lesions. Largest lesion in the RIGHT breast measures 4.2 cm. Multiple surgical biopsy clips noted. No supraclavicular adenopathy. No internal mammary adenopathy. No mediastinal hilar adenopathy. No pericardial fluid. Esophagus normal. Lungs/Pleura: No suspicious pulmonary nodules. Airways normal. No pleural nodularity. Musculoskeletal: No aggressive osseous lesion. CT ABDOMEN AND PELVIS FINDINGS Hepatobiliary:  No focal hepatic lesion.  Normal gallbladder. Pancreas: Pancreas is normal. No ductal dilatation. No pancreatic inflammation. Spleen: Normal spleen Adrenals/urinary tract: Adrenal glands and kidneys are normal. The ureters  and bladder normal. Stomach/Bowel: Stomach, small bowel, appendix, and cecum are normal. The colon and rectosigmoid colon are normal. Vascular/Lymphatic: Abdominal aorta is normal caliber. There is no retroperitoneal or periportal lymphadenopathy. No pelvic lymphadenopathy. Reproductive: Uterus and ovaries normal. Other: No peritoneal nodularity no omental thickening. Musculoskeletal: Low-density lesion measuring 6 mm within the L2 vertebral body (image 65, series 2). IMPRESSION: Chest Impression: 1. Bilateral breast nodules and masses. 2. No axillary adenopathy.  No intrathoracic adenopathy. 3. No pulmonary nodules. Abdomen / Pelvis Impression: 1. No metastatic disease in the abdomen or pelvis soft tissues. 2. Single lucent lesion the L2 vertebral body. Unlikely solitary metastasis. Recommend additional follow-up versus MRI or FDG PET scan if clinical suspicion sufficient for bone metastasis. Electronically Signed   By: Genevive Bi M.D.   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  PLAN:    1. 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 CT scan and bone scan results reviewed independently with no obvious evidence of metastatic disease. Lesion at L2 vertebral body is negative. XRT will likely have no benefit in this certain scenario. Patient has follow-up with surgery later today to once again discuss the recommendation of full mastectomy as well as right breast surgery. Follow-up in  the [place] after her next surgery. 2. Right breast mass: Ultimately, patient will likely require lumpectomy or mastectomy of her right breast as well. 3. 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  The clinical significance of this variant is currently uncertain.  Approximately 30 minutes was spent in discussion of which greater than 50% was consultation.  Patient expressed understanding and was in agreement with this plan. She also understands that She can call clinic at any time with any questions, concerns, or complaints.   Cancer Staging Malignant phyllodes tumor of left breast [facility]) Staging form: Soft Tissue Sarcoma - Unusual Histologies and Sites, AJCC 8th Edition - Clinical stage from 03/18/2017: cN0, cM0, FNCLCC grade: G3 - Signed by Lloyd Huger, MD on 03/19/2017   Lloyd Huger, MD   04/05/2017 7:29 AM

## 2017-04-02 NOTE — Telephone Encounter (Signed)
Patient coming in today  

## 2017-04-03 ENCOUNTER — Ambulatory Visit: Payer: BLUE CROSS/BLUE SHIELD

## 2017-04-03 ENCOUNTER — Telehealth: Payer: Self-pay | Admitting: *Deleted

## 2017-04-03 ENCOUNTER — Encounter: Payer: Self-pay | Admitting: *Deleted

## 2017-04-03 NOTE — Telephone Encounter (Signed)
Patient called the office back and was notified of appointment with Dr. Marla Roe. She verbalizes understanding.

## 2017-04-03 NOTE — Telephone Encounter (Signed)
Message left on cell phone for patient to call the office.   Patient has been scheduled for an appointment with Dr. Audelia Hives for 04-14-17 at 3 pm (arrive 2:45 pm).

## 2017-04-04 ENCOUNTER — Ambulatory Visit: Payer: BLUE CROSS/BLUE SHIELD

## 2017-04-07 ENCOUNTER — Ambulatory Visit: Payer: BLUE CROSS/BLUE SHIELD

## 2017-04-08 ENCOUNTER — Other Ambulatory Visit: Payer: BLUE CROSS/BLUE SHIELD

## 2017-04-08 ENCOUNTER — Ambulatory Visit: Payer: BLUE CROSS/BLUE SHIELD

## 2017-04-09 ENCOUNTER — Ambulatory Visit: Payer: BLUE CROSS/BLUE SHIELD

## 2017-04-10 ENCOUNTER — Ambulatory Visit: Payer: BLUE CROSS/BLUE SHIELD

## 2017-04-11 ENCOUNTER — Ambulatory Visit: Payer: BLUE CROSS/BLUE SHIELD

## 2017-04-14 ENCOUNTER — Ambulatory Visit: Payer: BLUE CROSS/BLUE SHIELD

## 2017-04-15 ENCOUNTER — Telehealth: Payer: Self-pay | Admitting: General Surgery

## 2017-04-15 ENCOUNTER — Ambulatory Visit: Payer: BLUE CROSS/BLUE SHIELD

## 2017-04-15 NOTE — Telephone Encounter (Signed)
DR Maida Sale WITH PLASTIC SURGERY WOULD LIKE TO TALK WITH DR Bary Castilla ABOUT PATIENT.([phone]).

## 2017-04-16 ENCOUNTER — Encounter: Payer: Self-pay | Admitting: General Surgery

## 2017-04-16 ENCOUNTER — Ambulatory Visit: Payer: BLUE CROSS/BLUE SHIELD

## 2017-04-16 NOTE — Progress Notes (Signed)
I spoke with Kim Barker, D.O. after her recent assessment of the patient for reconstruction options.  The patient is indeed not a good candidate for August tissue transfer, and by Dr. Eusebio Friendly report patient has not particularly interested in this long procedure for reconstruction.  She would be a candidate for symmetry surgery and this could be completed either in conjunction with removal of the known fibroadenoma/fibroepithelial lesion/benign phyllodes tumors from the right breast or done after these are removed as a separate procedure.  We'll arrange for a visit with the patient to review the options and make a decision.  If mastectomy is not planned for the left breast, radiation is planned and certainly would have an impact on reconstruction options. There might be of benefit to delaying symmetry surgery until the full effects of the radiation on the previously treated left breast are seen.

## 2017-04-17 ENCOUNTER — Ambulatory Visit: Payer: BLUE CROSS/BLUE SHIELD

## 2017-04-18 ENCOUNTER — Ambulatory Visit: Payer: BLUE CROSS/BLUE SHIELD

## 2017-04-22 ENCOUNTER — Other Ambulatory Visit: Payer: BLUE CROSS/BLUE SHIELD

## 2017-04-22 ENCOUNTER — Ambulatory Visit: Payer: BLUE CROSS/BLUE SHIELD

## 2017-04-23 ENCOUNTER — Ambulatory Visit: Payer: BLUE CROSS/BLUE SHIELD

## 2017-04-24 ENCOUNTER — Ambulatory Visit: Payer: BLUE CROSS/BLUE SHIELD

## 2017-04-25 ENCOUNTER — Ambulatory Visit: Payer: BLUE CROSS/BLUE SHIELD

## 2017-04-28 ENCOUNTER — Ambulatory Visit: Payer: BLUE CROSS/BLUE SHIELD

## 2017-04-29 ENCOUNTER — Ambulatory Visit: Payer: BLUE CROSS/BLUE SHIELD

## 2017-04-30 ENCOUNTER — Ambulatory Visit: Payer: BLUE CROSS/BLUE SHIELD

## 2017-05-01 ENCOUNTER — Ambulatory Visit: Payer: BLUE CROSS/BLUE SHIELD

## 2017-05-01 ENCOUNTER — Ambulatory Visit: Payer: BLUE CROSS/BLUE SHIELD | Admitting: General Surgery

## 2017-05-02 ENCOUNTER — Ambulatory Visit: Payer: BLUE CROSS/BLUE SHIELD

## 2017-05-05 ENCOUNTER — Ambulatory Visit: Payer: BLUE CROSS/BLUE SHIELD

## 2017-05-06 ENCOUNTER — Other Ambulatory Visit: Payer: BLUE CROSS/BLUE SHIELD

## 2017-05-06 ENCOUNTER — Ambulatory Visit: Payer: BLUE CROSS/BLUE SHIELD

## 2017-05-07 ENCOUNTER — Ambulatory Visit: Payer: BLUE CROSS/BLUE SHIELD

## 2017-05-08 ENCOUNTER — Ambulatory Visit: Payer: BLUE CROSS/BLUE SHIELD

## 2017-05-09 ENCOUNTER — Ambulatory Visit: Payer: BLUE CROSS/BLUE SHIELD

## 2017-05-12 ENCOUNTER — Ambulatory Visit: Payer: BLUE CROSS/BLUE SHIELD

## 2017-05-12 ENCOUNTER — Encounter: Payer: Self-pay | Admitting: Oncology

## 2017-05-13 ENCOUNTER — Encounter: Payer: Self-pay | Admitting: General Surgery

## 2017-05-13 ENCOUNTER — Ambulatory Visit: Payer: BLUE CROSS/BLUE SHIELD

## 2017-05-13 ENCOUNTER — Ambulatory Visit (INDEPENDENT_AMBULATORY_CARE_PROVIDER_SITE_OTHER): Payer: BLUE CROSS/BLUE SHIELD | Admitting: General Surgery

## 2017-05-13 VITALS — BP 130/80 | HR 74 | Resp 14 | Ht 66.0 in | Wt 203.0 lb

## 2017-05-13 DIAGNOSIS — D242 Benign neoplasm of left breast: Secondary | ICD-10-CM

## 2017-05-13 DIAGNOSIS — D241 Benign neoplasm of right breast: Secondary | ICD-10-CM

## 2017-05-13 DIAGNOSIS — C50912 Malignant neoplasm of unspecified site of left female breast: Secondary | ICD-10-CM

## 2017-05-13 NOTE — Progress Notes (Signed)
Patient ID: Kim Barker, female   DOB: 02-26-1981, 36 y.o.   MRN: [id number]  Chief Complaint  Patient presents with  . Follow-up    HPI Kim Barker is a 36 y.o. female here today to discuss surgical options. The patient has met with Henrietta Hoover, D.O. for review of her plastic surgery options. She is not a candidate for TRAM flap reconstruction. The patient itself is cool to the idea of implants. Mother, Dorris present at visit.  HPI  Past Medical History:  Diagnosis Date  . Allergy   . Breast cancer, left ([place])    Pt had partial mastectomy 02/20/2017. 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 Breast mass    left breast present x 2 months  . GERD (gastroesophageal reflux disease)    PT WAS ON ACID REDUCER BUT HAS NOT TAKEN IN MONTHS  . Headache    MIGRAINES    Past Surgical History:  Procedure Laterality Date  . BREAST BIOPSY Right 02/20/2017   Procedure: BREAST BIOPSY;  Surgeon: Robert Bellow, MD;  Location: [facility] ORS;  Service: General;  Laterality: Right;  . BREAST LUMPECTOMY Right 36 years old   Benign  . BREAST RECONSTRUCTION WITH MASTOPLASTY Left 02/20/2017   Procedure: BREAST RECONSTRUCTION WITH MASTOPLASTY;  Surgeon: Robert Bellow, MD;  Location: [facility] ORS;  Service: General;  Laterality: Left;  . HYMENPLASTY  36 years old  . MASTECTOMY, PARTIAL Left 02/20/2017   Procedure: MASTECTOMY PARTIAL;  Surgeon: Robert Bellow, MD;  Location: [facility] ORS;  Service: General;  Laterality: Left;  . WISDOM TOOTH EXTRACTION  2004    Family History  Problem Relation Age of Onset  . Diabetes Mother   . Asthma Father   . Diabetes Father   . Asthma Brother   . Diabetes Maternal Grandmother   . COPD Paternal Grandfather   . Heart disease Paternal Grandfather   . Breast cancer Maternal Aunt 45  . Breast cancer Maternal Aunt 2  . Breast cancer Maternal Aunt 44    Social History Social History  Substance Use Topics  . Smoking  status: Never Smoker  . Smokeless tobacco: Never Used  . Alcohol use Yes     Comment: OCC    No Known Allergies  Current Outpatient Prescriptions  Medication Sig Dispense Refill  . HYDROcodone-acetaminophen (NORCO) 5-325 MG tablet Take 1-2 tablets by mouth every 4 (four) hours as needed for moderate pain. 30 tablet 0  . Melatonin 5 MG CAPS Take by mouth. 1 at hs for sleep     No current facility-administered medications for this visit.     Review of Systems Review of Systems  Constitutional: Negative.   Respiratory: Negative.   Cardiovascular: Negative.     Blood pressure 130/80, pulse 74, resp. rate 14, height '5\' 6"'  (1.676 m), weight 203 lb (92.1 kg).  Physical Exam Physical Exam  Constitutional: She appears well-developed and well-nourished.  Cardiovascular: Normal rate, regular rhythm and normal heart sounds.   Pulmonary/Chest: Effort normal and breath sounds normal.  Neurological: She is alert.  Skin: Skin is warm and dry.    Data Reviewed Pathology from the left breast fibroadenoma process by [facility] pathology: Fibroadenoma.  Pathology from the 2 right breast lesions biopsied at the time of her wide excision of the malignant phyllodes tumor within osteogenic sarcoma showed fibroadenoma versus changes consistent with a phyllodes tumor.   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  Multiple fibroadenomas/phyllodes lesions of both breasts.    Plan    We had a long discussion of all options including the recommendation from medical oncology for a mastectomy versus the option of postoperative radiation therapy to the left breast after excision for full examination of the suspected fibroadenoma in the lower outer quadrant with formal excision of the 2 right breast fibroepithelial lesions.  Pros and cons of deferral of mastectomy were reviewed as well as the recommendation for postoperative radiation therapy if  breast conservation is pursued.  The patient was not excited about the idea of left breast radiation therapy, but based on results from osteogenic sarcomas in the extremities, [place] would be strongly encouraged.  Would defer right breast reduction mammoplasty until 6 months after radiation therapy of the left breast so good symmetry could be obtained.  At this time the patient is not committed to the idea of mastectomy on the left side and I think this is reasonable. The closest margins run the skin, and if she is likely to have a local recurrence this would be clearly delineated on clinical exam. Mastectomy does not preclude the risk of late metastatic appearance.  I wouldn't anticipate a return to work without difficulty a few days after surgery..    Discussed options with patient. Patient to be scheduled for excision bilateral breast fibroadenoma.  HPI, Physical Exam, Assessment and Plan have been scribed under the direction and in the presence of Hervey Ard, MD.  Gaspar Cola, CMA  I have completed the exam and reviewed the above documentation for accuracy and completeness.  I agree with the above.  Haematologist has been used and any errors in dictation or transcription are unintentional.  Hervey Ard, M.D., F.A.C.S.   The patient is scheduled for surgery at [facility] on 06/26/17. She will pre admit by phone. The patient is aware of date and instructions.  Documented by Lesly Rubenstein LPN   Robert Bellow 05/13/2017, 7:21 PM

## 2017-05-13 NOTE — Patient Instructions (Addendum)
Discussed options with patient. Patient to be scheduled for excision bilateral breast fibroadenoma.  The patient is scheduled for surgery at [facility] on 06/26/17. She will pre admit by phone. The patient is aware of date and instructions.

## 2017-05-14 ENCOUNTER — Ambulatory Visit: Payer: BLUE CROSS/BLUE SHIELD

## 2017-05-15 ENCOUNTER — Ambulatory Visit: Payer: BLUE CROSS/BLUE SHIELD

## 2017-05-16 ENCOUNTER — Ambulatory Visit: Payer: BLUE CROSS/BLUE SHIELD

## 2017-05-19 ENCOUNTER — Ambulatory Visit: Payer: BLUE CROSS/BLUE SHIELD

## 2017-05-20 ENCOUNTER — Ambulatory Visit: Payer: BLUE CROSS/BLUE SHIELD

## 2017-05-21 ENCOUNTER — Ambulatory Visit: Payer: BLUE CROSS/BLUE SHIELD

## 2017-05-22 ENCOUNTER — Ambulatory Visit: Payer: BLUE CROSS/BLUE SHIELD

## 2017-06-16 ENCOUNTER — Inpatient Hospital Stay: Admission: RE | Admit: 2017-06-16 | Payer: BLUE CROSS/BLUE SHIELD | Source: Ambulatory Visit

## 2017-06-17 ENCOUNTER — Inpatient Hospital Stay: Admission: RE | Admit: 2017-06-17 | Payer: BLUE CROSS/BLUE SHIELD | Source: Ambulatory Visit

## 2017-06-18 ENCOUNTER — Inpatient Hospital Stay: Admission: RE | Admit: 2017-06-18 | Payer: BLUE CROSS/BLUE SHIELD | Source: Ambulatory Visit

## 2017-06-19 ENCOUNTER — Inpatient Hospital Stay: Admission: RE | Admit: 2017-06-19 | Payer: BLUE CROSS/BLUE SHIELD | Source: Ambulatory Visit

## 2017-06-19 ENCOUNTER — Telehealth: Payer: Self-pay | Admitting: *Deleted

## 2017-06-19 NOTE — Telephone Encounter (Signed)
Message left for patient to call the office.  I wanted to verify the message that she left with answering service last night.

## 2017-06-23 ENCOUNTER — Encounter: Payer: Self-pay | Admitting: Oncology

## 2017-06-23 ENCOUNTER — Inpatient Hospital Stay: Payer: BLUE CROSS/BLUE SHIELD | Attending: Oncology | Admitting: Oncology

## 2017-06-23 ENCOUNTER — Telehealth: Payer: Self-pay | Admitting: *Deleted

## 2017-06-23 ENCOUNTER — Inpatient Hospital Stay: Admission: RE | Admit: 2017-06-23 | Payer: BLUE CROSS/BLUE SHIELD | Source: Ambulatory Visit

## 2017-06-23 VITALS — BP 120/85 | HR 97 | Temp 98.3°F | Wt 208.4 lb

## 2017-06-23 DIAGNOSIS — Z9012 Acquired absence of left breast and nipple: Secondary | ICD-10-CM

## 2017-06-23 DIAGNOSIS — C50912 Malignant neoplasm of unspecified site of left female breast: Secondary | ICD-10-CM | POA: Insufficient documentation

## 2017-06-23 DIAGNOSIS — Z79899 Other long term (current) drug therapy: Secondary | ICD-10-CM | POA: Insufficient documentation

## 2017-06-23 DIAGNOSIS — K219 Gastro-esophageal reflux disease without esophagitis: Secondary | ICD-10-CM | POA: Insufficient documentation

## 2017-06-23 DIAGNOSIS — N6310 Unspecified lump in the right breast, unspecified quadrant: Secondary | ICD-10-CM | POA: Insufficient documentation

## 2017-06-23 NOTE — Telephone Encounter (Signed)
Woodfin Ganja is out of the office

## 2017-06-23 NOTE — Telephone Encounter (Signed)
Per Rulon Abide, NP patient can be added to schedule today at 2:30.

## 2017-06-23 NOTE — Telephone Encounter (Signed)
Patient called requesting advice regarding swelling and soreness of left breast.

## 2017-06-23 NOTE — Progress Notes (Signed)
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 CT scan and bone scan results reviewed independently with no obvious evidence of metastatic disease. Lesion at L2 vertebral body is negative. XRT will likely have no benefit in this certain scenario. Patient has follow-up with surgery later today to once again discuss the recommendation of full mastectomy as well as right breast surgery. Follow-up in the [place] after her next surgery.  Interval history- Patient returns to clinic today due to new onset of right breast and arm pain. This started this morning and she denies injury. She states she cannot feel any lumps or bumps but is concerned due to the acute onset. She is scheduled for surgery on Thursday with Dr. Tollie Pizza but wishes to cancel to have a second opinion at [facility]. She will see [facility] on Wednesday. She also states that she wishes to have Dr. Tollie Pizza do her surgery should she need it. Otherwise she feels well and is asymptomatic. She has no neurologic complaints. She denies any recent fevers or illnesses. She has a good appetite and denies weight loss. She has no chest pain or shortness of breath. She denies any nausea, vomiting, constipation, or  diarrhea. She has no urinary complaints. Patient offers no specific complaints today.  ECOG FS:0 - Asymptomatic  Review of systems- Review of Systems  Constitutional: Negative.  Negative for fever, malaise/fatigue and weight loss.  HENT: Negative.   Eyes: Negative.   Respiratory: Negative for shortness of breath.   Cardiovascular: Negative for chest pain.  Gastrointestinal: Negative for abdominal pain, nausea and vomiting.  Genitourinary: Negative.   Musculoskeletal: Positive for myalgias.       Right Breast pain   Skin: Negative.  Negative for itching and rash.  Neurological: Negative for sensory change and headaches.  Endo/Heme/Allergies: Negative.   Psychiatric/Behavioral: Negative.      Current treatment- Previous lumpectomy left breast: Recommend mastectomy. Would like second opinion at [facility].   No Known Allergies   Past Medical History:  Diagnosis Date  . Allergy   . Breast cancer, left ([place])    Pt had partial mastectomy 02/20/2017. 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 Breast mass    left breast present x 2 months  . GERD (gastroesophageal reflux disease)    PT WAS ON ACID REDUCER BUT HAS NOT TAKEN IN MONTHS  . Headache    MIGRAINES     Past Surgical History:  Procedure Laterality Date  . BREAST BIOPSY Right 02/20/2017   Procedure: BREAST BIOPSY;  Surgeon: Robert Bellow, MD;  Location: [facility] ORS;  Service: General;  Laterality: Right;  . BREAST LUMPECTOMY Right 36 years old   Benign  . BREAST RECONSTRUCTION WITH MASTOPLASTY Left 02/20/2017   Procedure: BREAST RECONSTRUCTION WITH MASTOPLASTY;  Surgeon: Robert Bellow, MD;  Location: [facility] ORS;  Service: General;  Laterality: Left;  . HYMENPLASTY  36 years old  . MASTECTOMY, PARTIAL Left 02/20/2017   Procedure: MASTECTOMY PARTIAL;  Surgeon: Robert Bellow, MD;  Location: [facility] ORS;  Service: General;  Laterality: Left;  . WISDOM TOOTH EXTRACTION  2004    Social  History   Social History  . Marital status: Single    Spouse name: N/A  . Number of children: N/A  . Years of education: N/A   Occupational History  . Not on file.   Social History Main Topics  . Smoking status: Never Smoker  . Smokeless tobacco: Never Used  . Alcohol use Yes     Comment: OCC  . Drug use: No  . Sexual activity: Not on file     Comment: LMP 03/04/2017   Other Topics Concern  . Not on file   Social History Narrative  . No narrative on file    Family History  Problem Relation Age of Onset  . Diabetes Mother   . Asthma Father   . Diabetes Father   . Asthma Brother   . Diabetes Maternal Grandmother   . COPD Paternal Grandfather   . Heart disease Paternal Grandfather   . Breast cancer Maternal Aunt 72  . Breast cancer Maternal Aunt 50  . Breast cancer Maternal Aunt 40     Current Outpatient Prescriptions:  .  diphenhydramine-acetaminophen (TYLENOL PM) 25-500 MG TABS tablet, Take by mouth., Disp: , Rfl:  .  Melatonin 5 MG CAPS, Take by mouth. 1 at hs for sleep, Disp: , Rfl:  .  HYDROcodone-acetaminophen (NORCO) 5-325 MG tablet, Take 1-2 tablets by mouth every 4 (four) hours as needed for moderate pain. (Patient not taking: Reported on 06/23/2017), Disp: 30 tablet, Rfl: 0 .  ibuprofen (ADVIL,MOTRIN) 800 MG tablet, Take 800 mg by mouth., Disp: , Rfl:   Physical exam:  Vitals:   06/23/17 1513  BP: 120/85  Pulse: 97  Temp: 98.3 F (36.8 C)  TempSrc: Oral  Weight: 208 lb 6.4 oz (94.5 kg)   Physical Exam  Constitutional: She is oriented to person, place, and time and well-developed, well-nourished, and in no distress.  HENT:  Head: Normocephalic and atraumatic.  Neck: Normal range of motion.  Cardiovascular: Normal rate, regular rhythm, normal heart sounds and intact distal pulses.   Pulmonary/Chest: Effort normal and breath sounds normal. She exhibits tenderness.    Abdominal: Soft. Normal appearance and bowel sounds are normal. There is no  tenderness.  Musculoskeletal: Normal range of motion.  Neurological: She is alert and oriented to person, place, and time.  Skin: Skin is warm and dry.     CMP Latest Ref Rng & Units 02/05/2014  Glucose 65 - 99 mg/dL 66  BUN 7 - 18 mg/dL 8  Creatinine 0.60 - 1.30 mg/dL 0.80  Sodium 136 - 145 mmol/L 137  Potassium 3.5 - 5.1 mmol/L 3.7  Chloride 98 - 107 mmol/L 106  CO2 21 - 32 mmol/L 27  Calcium 8.5 - 10.1 mg/dL 8.7  Total Protein 6.4 - 8.2 g/dL 7.7  Total Bilirubin 0.2 - 1.0 mg/dL 0.4  Alkaline Phos Unit/L 49  AST 15 - 37 Unit/L 16  ALT 12 - 78 U/L 11(L)   CBC Latest Ref Rng & Units 02/05/2014  WBC 3.6 - 11.0 x10 3/mm 3 7.0  Hemoglobin 12.0 - 16.0 g/dL 12.3  Hematocrit 35.0 - 47.0 % 36.3  Platelets 150 - 440  x10 3/mm 3 252    No images are attached to the encounter.  No results found.   Assessment and plan- Patient is a 36 y.o. female who woke up this morning with right breast and arm pain. Breast exam did not reveal a mass. She did exhibit tenderness. Lumpectomy scar noted on left breast. She is anxious.   1. Right breast tenderness: No mass identified. She has canceled her surgery for total mastectomy of left breast with Dr. Tollie Pizza. She has an appointment with [facility] for second opinion on Wednesday. She will call us and let us know what she decides to do and if she would like follow-up.    Visit Diagnosis 1. Malignant phyllodes tumor of left breast [facility])     Patient expressed understanding and was in agreement with this plan. She also understands that She can call clinic at any time with any questions, concerns, or complaints.    Marisue Humble [facility] at [facility] [phone] 06/27/2017 1:01 PM

## 2017-06-23 NOTE — Telephone Encounter (Signed)
Notified patient of 2:30 appointment.

## 2017-06-25 NOTE — Telephone Encounter (Signed)
Another message was left for patient to call the office.   Per answering service (message that was left on 06-18-17 at 8:53 pm), patient was going out of town and would call back to reschedule surgery that was scheduled for 06-26-17. Also, states she was cancelling due to financial reasons.   [place] has been notified of cancellation.

## 2017-06-26 ENCOUNTER — Encounter: Admission: RE | Payer: Self-pay | Source: Ambulatory Visit

## 2017-06-26 ENCOUNTER — Ambulatory Visit
Admission: RE | Admit: 2017-06-26 | Payer: BLUE CROSS/BLUE SHIELD | Source: Ambulatory Visit | Admitting: General Surgery

## 2017-06-26 SURGERY — BREAST BIOPSY
Anesthesia: Choice | Laterality: Bilateral

## 2017-08-28 ENCOUNTER — Encounter: Payer: Self-pay | Admitting: Emergency Medicine

## 2017-08-28 DIAGNOSIS — R300 Dysuria: Secondary | ICD-10-CM | POA: Insufficient documentation

## 2017-08-28 DIAGNOSIS — Z791 Long term (current) use of non-steroidal anti-inflammatories (NSAID): Secondary | ICD-10-CM | POA: Insufficient documentation

## 2017-08-28 LAB — CBC
HCT: 33.5 % — ABNORMAL LOW (ref 35.0–47.0)
HEMOGLOBIN: 11.2 g/dL — AB (ref 12.0–16.0)
MCH: 28.1 pg (ref 26.0–34.0)
MCHC: 33.4 g/dL (ref 32.0–36.0)
MCV: 84 fL (ref 80.0–100.0)
Platelets: 330 10*3/uL (ref 150–440)
RBC: 3.98 MIL/uL (ref 3.80–5.20)
RDW: 15.9 % — AB (ref 11.5–14.5)
WBC: 9.8 10*3/uL (ref 3.6–11.0)

## 2017-08-28 LAB — URINALYSIS, COMPLETE (UACMP) WITH MICROSCOPIC
Bilirubin Urine: NEGATIVE
GLUCOSE, UA: NEGATIVE mg/dL
HGB URINE DIPSTICK: NEGATIVE
Ketones, ur: NEGATIVE mg/dL
NITRITE: NEGATIVE
Protein, ur: NEGATIVE mg/dL
SPECIFIC GRAVITY, URINE: 1.023 (ref 1.005–1.030)
pH: 5 (ref 5.0–8.0)

## 2017-08-28 LAB — POCT PREGNANCY, URINE: Preg Test, Ur: NEGATIVE

## 2017-08-28 NOTE — ED Triage Notes (Signed)
Pt c/o pelvic pain x1 week. Pt reports she went to PCP and they tested her for STD and yeast infection but did not collect blood or urine. Pt also c/o dysuria x2 days. Pt denies N/V.

## 2017-08-29 ENCOUNTER — Emergency Department
Admission: EM | Admit: 2017-08-29 | Discharge: 2017-08-29 | Disposition: A | Discharge status: Home / Self Care | Payer: Self-pay | Attending: Emergency Medicine | Admitting: Emergency Medicine

## 2017-08-29 DIAGNOSIS — R300 Dysuria: Secondary | ICD-10-CM

## 2017-08-29 DIAGNOSIS — R1030 Lower abdominal pain, unspecified: Secondary | ICD-10-CM

## 2017-08-29 LAB — COMPREHENSIVE METABOLIC PANEL
ALK PHOS: 48 U/L (ref 38–126)
ALT: 11 U/L — AB (ref 14–54)
AST: 18 U/L (ref 15–41)
Albumin: 4.1 g/dL (ref 3.5–5.0)
Anion gap: 9 (ref 5–15)
BUN: 14 mg/dL (ref 6–20)
CALCIUM: 9.2 mg/dL (ref 8.9–10.3)
CHLORIDE: 107 mmol/L (ref 101–111)
CO2: 22 mmol/L (ref 22–32)
CREATININE: 0.85 mg/dL (ref 0.44–1.00)
GFR calc Af Amer: >60 mL/min (ref >60)
GFR calc non Af Amer: >60 mL/min (ref >60)
Glucose, Bld: 91 mg/dL (ref 65–99)
Potassium: 3.8 mmol/L (ref 3.5–5.1)
SODIUM: 138 mmol/L (ref 135–145)
Total Bilirubin: 0.5 mg/dL (ref 0.3–1.2)
Total Protein: 7.9 g/dL (ref 6.5–8.1)

## 2017-08-29 LAB — LIPASE, BLOOD: LIPASE: 27 U/L (ref 11–51)

## 2017-08-29 MED ORDER — CEPHALEXIN 500 MG PO CAPS: 500.0000 mg | ORAL_CAPSULE | Freq: Two times a day (BID) | ORAL | 0 refills | Status: AC

## 2017-08-29 NOTE — ED Notes (Signed)
Family at bedside. 

## 2017-08-29 NOTE — ED Provider Notes (Signed)
[facility] Emergency Department Provider Note  ____________________________________________   First MD Initiated Contact with Patient 08/29/17 [phone]     (approximate)  I have reviewed the triage vital signs and the nursing notes.   HISTORY  Chief Complaint Abdominal Pain and Dysuria    HPI Kim Barker is a 36 y.o. female with noncontributory medical history who presents for evaluation of some burning dysuria over the last week as well as several days of intermittent stabbing suprapubic pain.  She was seen after the symptoms started about a week ago at the [place] and had a pelvic exam and STD testing performed which was all negative.  However they did not do any blood work or urinalysis.  Her symptoms have gotten a little bit worse.  She has seen no blood in her urine.  She denies nausea, vomiting, fever/chills, chest pain, shortness of breath.  Nothing in particular makes the patient's symptoms better nor worse.    She has not had any diarrhea nor constipation.   Past Medical History:  Diagnosis Date  . Allergy   . Breast cancer, left ([place])    Pt had partial mastectomy 02/20/2017. 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 Breast mass    left breast present x 2 months  . GERD (gastroesophageal reflux disease)    PT WAS ON ACID REDUCER BUT HAS NOT TAKEN IN MONTHS  . Headache    MIGRAINES    Patient Active Problem List   Diagnosis Date Noted  . Fibroadenoma of both breasts 03/04/2017  . Left breast mass 02/12/2017  . Phyllodes tumor of breast 02/09/2017  . Umbilical hernia without obstruction or gangrene 03/21/2016  . Sciatica 04/10/2012    Past Surgical History:  Procedure Laterality Date  . BREAST BIOPSY Right 02/20/2017   Procedure: BREAST BIOPSY;  Surgeon: Robert Bellow, MD;  Location: [facility] ORS;  Service: General;  Laterality: Right;  . BREAST LUMPECTOMY Right 36 years old     Benign  . BREAST RECONSTRUCTION WITH MASTOPLASTY Left 02/20/2017   Procedure: BREAST RECONSTRUCTION WITH MASTOPLASTY;  Surgeon: Robert Bellow, MD;  Location: [facility] ORS;  Service: General;  Laterality: Left;  . HYMENPLASTY  36 years old  . MASTECTOMY, PARTIAL Left 02/20/2017   Procedure: MASTECTOMY PARTIAL;  Surgeon: Robert Bellow, MD;  Location: [facility] ORS;  Service: General;  Laterality: Left;  . [place] EXTRACTION  2004    Prior to Admission medications   Medication Sig Start Date End Date Taking? Authorizing Provider  cephALEXin (KEFLEX) 500 MG capsule Take 1 capsule (500 mg total) by mouth 2 (two) times daily. 08/29/17 09/01/17  Hinda Kehr, MD  diphenhydramine-acetaminophen (TYLENOL PM) 25-500 MG TABS tablet Take by mouth.    [provider]  HYDROcodone-acetaminophen (NORCO) 5-325 MG tablet Take 1-2 tablets by mouth every 4 (four) hours as needed for moderate pain. Patient not taking: Reported on 06/23/2017 02/20/17   Robert Bellow, MD  ibuprofen (ADVIL,MOTRIN) 800 MG tablet Take 800 mg by mouth.    [provider]  Melatonin 5 MG CAPS Take by mouth. 1 at hs for sleep    [provider]    Allergies Patient has no known allergies.  Family History  Problem Relation Age of Onset  . Diabetes Mother   . Asthma Father   . Diabetes Father   . Asthma Brother   . Diabetes Maternal Grandmother   . COPD Paternal Grandfather   .  Heart disease Paternal Grandfather   . Breast cancer Maternal Aunt 21  . Breast cancer Maternal Aunt 3  . Breast cancer Maternal Aunt 38    Social History Social History  Substance Use Topics  . Smoking status: Never Smoker  . Smokeless tobacco: Never Used  . Alcohol use Yes     Comment: OCC    Review of Systems Constitutional: No fever/chills Eyes: No visual changes. ENT: No sore throat. Cardiovascular: Denies chest pain. Respiratory: Denies shortness of breath. Gastrointestinal: intermittent mild to  moderate sharp stabbing suprapubic pain.  Denies nausea, vomiting, constipation, and diarrhea Genitourinary: +dysuria. Musculoskeletal: Negative for neck pain.  Negative for back pain. Integumentary: Negative for rash. Neurological: Negative for headaches, focal weakness or numbness.   ____________________________________________   PHYSICAL EXAM:  VITAL SIGNS: ED Triage Vitals  Enc Vitals Group     BP 08/28/17 2328 (!) 149/94     Pulse Rate 08/28/17 2328 90     Resp 08/28/17 2328 16     Temp 08/28/17 2328 97.8 F (36.6 C)     Temp Source 08/28/17 2328 Oral     SpO2 08/28/17 2328 100 %     Weight 08/28/17 2329 94.3 kg (208 lb)     Height --      Head Circumference --      Peak Flow --      Pain Score 08/29/17 0255 6     Pain Loc --      Pain Edu? --      Excl. in [place]? --     Constitutional: Alert and oriented. Well appearing and in no acute distress. Eyes: Conjunctivae are normal.  Head: Atraumatic. Cardiovascular: Normal rate, regular rhythm. Good peripheral circulation. Grossly normal heart sounds. Respiratory: Normal respiratory effort.  No retractions. Lungs CTAB. Gastrointestinal: Soft and nontender. No distention.  Genitourinary: deferred since patient just had STD testing and a pelvic exam about a week ago Musculoskeletal: No lower extremity tenderness nor edema. No gross deformities of extremities. Neurologic:  Normal speech and language. No gross focal neurologic deficits are appreciated.  Skin:  Skin is warm, dry and intact. No rash noted. Psychiatric: Mood and affect are normal. Speech and behavior are normal.  ____________________________________________   LABS (all labs ordered are listed, but only abnormal results are displayed)  Labs Reviewed  COMPREHENSIVE METABOLIC PANEL - Abnormal; Notable for the following:       Result Value   ALT 11 (*)    All other components within normal limits  CBC - Abnormal; Notable for the following:    Hemoglobin 11.2  (*)    HCT 33.5 (*)    RDW 15.9 (*)    All other components within normal limits  URINALYSIS, COMPLETE (UACMP) WITH MICROSCOPIC - Abnormal; Notable for the following:    Color, Urine YELLOW (*)    APPearance HAZY (*)    Leukocytes, UA SMALL (*)    Bacteria, UA RARE (*)    Squamous Epithelial / LPF 0-5 (*)    All other components within normal limits  URINE CULTURE  LIPASE, BLOOD  POC URINE PREG, ED  POCT PREGNANCY, URINE   ____________________________________________  EKG  None - EKG not ordered by ED physician ____________________________________________  RADIOLOGY   No results found.  ____________________________________________   PROCEDURES  Critical Care performed: No   Procedure(s) performed:   Procedures   ____________________________________________   INITIAL IMPRESSION / ASSESSMENT AND PLAN / ED COURSE  As part of my medical decision making,  I reviewed the following data within the [place] reviewed [facility] Course for details)     the patient has rare bacteria and her urine but labs are otherwise unremarkable.  She was just tested for STDs and had a pelvic exam and I do not feel that repeat testing is necessary and she does not want another pelvic exam if it is not required.  She has no tenderness to palpation on her exam. Differential diagnosis includes, but is not limited to, ovarian cyst, ovarian torsion, acute appendicitis, diverticulitis, urinary tract infection/pyelonephritis, endometriosis, bowel obstruction, colitis, renal colic, gastroenteritis, hernia, pregnancy related pain including ectopic pregnancy, etc, but her workup is reassuring today with no evidence of acute abnormality.  Because she started having symptoms of dysuria and that has persisted, I will send a urine culture and give her 3 days of treatment with Keflex for the possibility of a very mild urinary tract infection.  I counseled her to follow up as an  outpatient.  I gave my usual and customary return precautions.  She understands and agrees with the plan     ____________________________________________  FINAL CLINICAL IMPRESSION(S) / ED DIAGNOSES  Final diagnoses:  Dysuria  Lower abdominal pain     MEDICATIONS GIVEN DURING THIS VISIT:  Medications - No data to display   NEW OUTPATIENT MEDICATIONS STARTED DURING THIS VISIT:  New Prescriptions   CEPHALEXIN (KEFLEX) 500 MG CAPSULE    Take 1 capsule (500 mg total) by mouth 2 (two) times daily.    Modified Medications   No medications on file    Discontinued Medications   No medications on file     Note:  This document was prepared using Dragon voice recognition software and may include unintentional dictation errors.    Hinda Kehr, MD 08/29/17 [phone]

## 2017-08-29 NOTE — Discharge Instructions (Signed)
You have been seen in the Emergency Department (ED) for abdominal pain.  Your evaluation did not identify a clear cause of your symptoms but was generally reassuring.  Since you are having urinary symptoms, we will give you some antibiotics for the possibility of a urinary tract infection.  We also sent a urine culture.  Please follow up as instructed above regarding today?s emergent visit and the symptoms that are bothering you.  Return to the ED if your abdominal pain worsens or fails to improve, you develop bloody vomiting, bloody diarrhea, you are unable to tolerate fluids due to vomiting, fever greater than 101, or other symptoms that concern you.

## 2017-08-30 LAB — URINE CULTURE
Culture: <10000 — AB
SPECIAL REQUESTS: NORMAL

## 2017-09-23 ENCOUNTER — Encounter: Payer: Self-pay | Admitting: Emergency Medicine

## 2017-09-23 DIAGNOSIS — Z79899 Other long term (current) drug therapy: Secondary | ICD-10-CM | POA: Insufficient documentation

## 2017-09-23 DIAGNOSIS — N921 Excessive and frequent menstruation with irregular cycle: Secondary | ICD-10-CM | POA: Insufficient documentation

## 2017-09-23 LAB — URINALYSIS, COMPLETE (UACMP) WITH MICROSCOPIC
BILIRUBIN URINE: NEGATIVE
Bacteria, UA: NONE SEEN
Glucose, UA: NEGATIVE mg/dL
Ketones, ur: NEGATIVE mg/dL
Nitrite: NEGATIVE
PH: 6 (ref 5.0–8.0)
Protein, ur: 30 mg/dL — AB
SPECIFIC GRAVITY, URINE: 1.028 (ref 1.005–1.030)

## 2017-09-23 LAB — POCT PREGNANCY, URINE: Preg Test, Ur: NEGATIVE

## 2017-09-23 NOTE — ED Triage Notes (Signed)
Patient to ER for continued pelvic pain. States she was seen here previously (on 08/29/17) for the same, treated but states pain only slightly improved. States since then, pelvic pain came back again to same pain degree and also developed vaginal bleeding (was only 2 weeks after period prior to that).

## 2017-09-24 ENCOUNTER — Emergency Department: Payer: Self-pay

## 2017-09-24 ENCOUNTER — Emergency Department
Admission: EM | Admit: 2017-09-24 | Discharge: 2017-09-24 | Disposition: A | Discharge status: Home / Self Care | Payer: Self-pay | Attending: Emergency Medicine | Admitting: Emergency Medicine

## 2017-09-24 DIAGNOSIS — N921 Excessive and frequent menstruation with irregular cycle: Secondary | ICD-10-CM

## 2017-09-24 DIAGNOSIS — R102 Pelvic and perineal pain: Secondary | ICD-10-CM

## 2017-09-24 MED ORDER — NORGESTIMATE-ETH ESTRADIOL 0.25-35 MG-MCG PO TABS: 1.0000 | ORAL_TABLET | Freq: Every day | ORAL | 2 refills | Status: DC

## 2017-09-24 NOTE — Discharge Instructions (Addendum)
As we discussed, although you are having intermittent pelvic pain and irregular/abnormal vaginal bleeding, it is not dangerous at this time.  Please follow up with the GYN doctor indicated in this document or another provider of your choosing.  If you cannot be seen relatively soon, you have a prescription for birth control pills which should stop the bleeding and may help with the pelvic pain.  Please return to the emergency department if you develop any new or worsening symptoms that concern you.  Remember that tobacco use greatly increases her risk of developing blood clots while taking birth control pills and avoid smoking or using any other tobacco products.

## 2017-09-24 NOTE — ED Provider Notes (Signed)
[facility] Emergency Department Provider Note  ____________________________________________   First MD Initiated Contact with Patient 09/24/17 0145     (approximate)  I have reviewed the triage vital signs and the nursing notes.   HISTORY  Chief Complaint Pelvic Pain and Vaginal Bleeding    HPI Kim Barker is a 36 y.o. female with medical history as listed below who presents for evaluation of persistent pelvic pain over the last few weeks.  Additionally she has had irregular vaginal bleeding over the last several weeks as well.  I saw the patient about 4 weeks ago for pelvic pain but no vaginal bleeding at that time.  She had just received [facility] so we opted not to do a pelvic exam at that time.  She did not require any imaging at the time and was comfortable with the plan for outpatient follow-up with OB/GYN.  However she states that she just recently got insurance so she has not yet followed up with OB.  She continues to have intermittent cramping and aching pelvic pain that may wax and wane between mild to severe.  She is currently in no acute distress.  She also states that shortly after having her last menstrual cycle she started to have bleeding again which is been intermittent over the last couple of weeks.  She states that is currently relatively heavy.  She has no lightheadedness or dizziness.  The pain is not sharp and nothing in particular makes it better nor worse.  She denies fever/chills, chest pain, shortness of breath, nausea, vomiting, upper abdominal pain, and dysuria.  Past Medical History:  Diagnosis Date  . Allergy   . Breast cancer, left ([place])    Pt had partial mastectomy 02/20/2017. 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 Breast mass    left breast present x 2 months  . GERD (gastroesophageal reflux disease)    PT WAS ON ACID REDUCER BUT HAS  NOT TAKEN IN MONTHS  . Headache    MIGRAINES    Patient Active Problem List   Diagnosis Date Noted  . Fibroadenoma of both breasts 03/04/2017  . Left breast mass 02/12/2017  . Phyllodes tumor of breast 02/09/2017  . Umbilical hernia without obstruction or gangrene 03/21/2016  . Sciatica 04/10/2012    Past Surgical History:  Procedure Laterality Date  . BREAST BIOPSY Right 02/20/2017   Procedure: BREAST BIOPSY;  Surgeon: Robert Bellow, MD;  Location: [facility] ORS;  Service: General;  Laterality: Right;  . BREAST LUMPECTOMY Right 36 years old   Benign  . BREAST RECONSTRUCTION WITH MASTOPLASTY Left 02/20/2017   Procedure: BREAST RECONSTRUCTION WITH MASTOPLASTY;  Surgeon: Robert Bellow, MD;  Location: [facility] ORS;  Service: General;  Laterality: Left;  . HYMENPLASTY  36 years old  . MASTECTOMY, PARTIAL Left 02/20/2017   Procedure: MASTECTOMY PARTIAL;  Surgeon: Robert Bellow, MD;  Location: [facility] ORS;  Service: General;  Laterality: Left;  . [place] EXTRACTION  2004    Prior to Admission medications   Medication Sig Start Date End Date Taking? Authorizing Provider  diphenhydramine-acetaminophen (TYLENOL PM) 25-500 MG TABS tablet Take by mouth.    [provider]  HYDROcodone-acetaminophen (NORCO) 5-325 MG tablet Take 1-2 tablets by mouth every 4 (four) hours as needed for moderate pain. Patient not taking: Reported on 06/23/2017 02/20/17   Robert Bellow, MD  ibuprofen (ADVIL,MOTRIN) 800 MG tablet  Take 800 mg by mouth.    [provider]  Melatonin 5 MG CAPS Take by mouth. 1 at hs for sleep    [provider]  norgestimate-ethinyl estradiol (ORTHO-CYCLEN,SPRINTEC,PREVIFEM) 0.25-35 MG-MCG tablet Take 1 tablet by mouth daily. 09/24/17   Hinda Kehr, MD    Allergies Patient has no known allergies.  Family History  Problem Relation Age of Onset  . Diabetes Mother   . Asthma Father   . Diabetes Father   . Asthma Brother   . Diabetes Maternal  Grandmother   . COPD Paternal Grandfather   . Heart disease Paternal Grandfather   . Breast cancer Maternal Aunt 39  . Breast cancer Maternal Aunt 50  . Breast cancer Maternal Aunt 48    Social History Social History  Substance Use Topics  . Smoking status: Never Smoker  . Smokeless tobacco: Never Used  . Alcohol use Yes     Comment: OCC    Review of Systems Constitutional: No fever/chills Eyes: No visual changes. ENT: No sore throat. Cardiovascular: Denies chest pain. Respiratory: Denies shortness of breath. Gastrointestinal: Intermittent but persistent pelvic pain over the last month.  No nausea, no vomiting.  No diarrhea.  No constipation. Genitourinary: Irregular vaginal bleeding over the last several weeks Musculoskeletal: Negative for neck pain.  Negative for back pain. Integumentary: Negative for rash. Neurological: Negative for headaches, focal weakness or numbness.   ____________________________________________   PHYSICAL EXAM:  VITAL SIGNS: ED Triage Vitals  Enc Vitals Group     BP 09/23/17 2112 (!) 149/97     Pulse Rate 09/23/17 2112 (!) 105     Resp 09/23/17 2112 18     Temp 09/23/17 2112 98.4 F (36.9 C)     Temp Source 09/23/17 2112 Oral     SpO2 09/23/17 2112 100 %     Weight 09/23/17 2113 89.8 kg (198 lb)     Height 09/23/17 2113 1.626 m (5\' 4" )     Head Circumference --      Peak Flow --      Pain Score 09/23/17 2112 7     Pain Loc --      Pain Edu? --      Excl. in [place]? --     Constitutional: Alert and oriented. Well appearing and in no acute distress. Eyes: Conjunctivae are normal.  Head: Atraumatic. Nose: No congestion/rhinnorhea. Mouth/Throat: Mucous membranes are moist. Neck: No stridor.  No meningeal signs.   Cardiovascular: Normal rate, regular rhythm. Good peripheral circulation. Grossly normal heart sounds. Respiratory: Normal respiratory effort.  No retractions. Lungs CTAB. Gastrointestinal: Soft and nontender. No distention.    Genitourinary: deferred Musculoskeletal: No lower extremity tenderness nor edema. No gross deformities of extremities. Neurologic:  Normal speech and language. No gross focal neurologic deficits are appreciated.  Skin:  Skin is warm, dry and intact. No rash noted. Psychiatric: Mood and affect are normal. Speech and behavior are normal.  ____________________________________________   LABS (all labs ordered are listed, but only abnormal results are displayed)  Labs Reviewed  URINALYSIS, COMPLETE (UACMP) WITH MICROSCOPIC - Abnormal; Notable for the following:       Result Value   Color, Urine YELLOW (*)    APPearance CLEAR (*)    Hgb urine dipstick LARGE (*)    Protein, ur 30 (*)    Leukocytes, UA TRACE (*)    Squamous Epithelial / LPF 0-5 (*)    All other components within normal limits  POC URINE PREG, ED  POCT PREGNANCY, URINE   ____________________________________________  EKG  None - EKG not ordered by ED physician ____________________________________________  RADIOLOGY   US Transvaginal Non-ob  Result Date: 09/24/2017 CLINICAL DATA:  36 year old female with intermittent pelvic pain and vaginal bleeding. EXAM: TRANSABDOMINAL AND TRANSVAGINAL ULTRASOUND OF PELVIS DOPPLER ULTRASOUND OF OVARIES TECHNIQUE: Both transabdominal and transvaginal ultrasound examinations of the pelvis were performed. Transabdominal technique was performed for global imaging of the pelvis including uterus, ovaries, adnexal regions, and pelvic cul-de-sac. It was necessary to proceed with endovaginal exam following the transabdominal exam to visualize the endometrium and the ovaries. Color and duplex Doppler ultrasound was utilized to evaluate blood flow to the ovaries. COMPARISON:  CT of the abdomen pelvis dated 03/17/2017 FINDINGS: Uterus Measurements: 13.0 x 6.3 x 8.3 cm. The uterus is enlarged with multiple fibroids. There is a 2.4 x 1.8 x 2.7 cm intramural right fundal fibroid. There is a 2.1 x 2.5  x 1.6 cm intramural posterior fundal fibroid. There is a 2.4 x 2.3 x 2.1 cm left fundal fibroid. Endometrium Thickness: 5 mm.  No focal abnormality visualized. Right ovary Measurements: 2.9 x 1.7 x 2.2 cm. Normal appearance/no adnexal mass. Left ovary Measurements: 4.1 x 2.1 x 3.0 cm. There is a 2.2 x 2.3 x 2.2 cm cyst in the left ovary. Pulsed Doppler evaluation of both ovaries demonstrates normal low-resistance arterial and venous waveforms. Other findings No abnormal free fluid. IMPRESSION: 1. Bilateral ovarian Doppler detected arterial and venous flow. 2. Mildly enlarged and myomatous uterus. 3. Unremarkable endometrium. Electronically Signed   By: Anner Crete M.D.   On: 09/24/2017 03:59   US Pelvis Complete  Result Date: 09/24/2017 CLINICAL DATA:  36 year old female with intermittent pelvic pain and vaginal bleeding. EXAM: TRANSABDOMINAL AND TRANSVAGINAL ULTRASOUND OF PELVIS DOPPLER ULTRASOUND OF OVARIES TECHNIQUE: Both transabdominal and transvaginal ultrasound examinations of the pelvis were performed. Transabdominal technique was performed for global imaging of the pelvis including uterus, ovaries, adnexal regions, and pelvic cul-de-sac. It was necessary to proceed with endovaginal exam following the transabdominal exam to visualize the endometrium and the ovaries. Color and duplex Doppler ultrasound was utilized to evaluate blood flow to the ovaries. COMPARISON:  CT of the abdomen pelvis dated 03/17/2017 FINDINGS: Uterus Measurements: 13.0 x 6.3 x 8.3 cm. The uterus is enlarged with multiple fibroids. There is a 2.4 x 1.8 x 2.7 cm intramural right fundal fibroid. There is a 2.1 x 2.5 x 1.6 cm intramural posterior fundal fibroid. There is a 2.4 x 2.3 x 2.1 cm left fundal fibroid. Endometrium Thickness: 5 mm.  No focal abnormality visualized. Right ovary Measurements: 2.9 x 1.7 x 2.2 cm. Normal appearance/no adnexal mass. Left ovary Measurements: 4.1 x 2.1 x 3.0 cm. There is a 2.2 x 2.3 x 2.2 cm  cyst in the left ovary. Pulsed Doppler evaluation of both ovaries demonstrates normal low-resistance arterial and venous waveforms. Other findings No abnormal free fluid. IMPRESSION: 1. Bilateral ovarian Doppler detected arterial and venous flow. 2. Mildly enlarged and myomatous uterus. 3. Unremarkable endometrium. Electronically Signed   By: Anner Crete M.D.   On: 09/24/2017 03:59   [place] Art/ven Flow Abd Pelv Doppler  Result Date: 09/24/2017 CLINICAL DATA:  36 year old female with intermittent pelvic pain and vaginal bleeding. EXAM: TRANSABDOMINAL AND TRANSVAGINAL ULTRASOUND OF PELVIS DOPPLER ULTRASOUND OF OVARIES TECHNIQUE: Both transabdominal and transvaginal ultrasound examinations of the pelvis were performed. Transabdominal technique was performed for global imaging of the pelvis including uterus, ovaries, adnexal regions, and pelvic cul-de-sac. It was necessary  to proceed with endovaginal exam following the transabdominal exam to visualize the endometrium and the ovaries. Color and duplex Doppler ultrasound was utilized to evaluate blood flow to the ovaries. COMPARISON:  CT of the abdomen pelvis dated 03/17/2017 FINDINGS: Uterus Measurements: 13.0 x 6.3 x 8.3 cm. The uterus is enlarged with multiple fibroids. There is a 2.4 x 1.8 x 2.7 cm intramural right fundal fibroid. There is a 2.1 x 2.5 x 1.6 cm intramural posterior fundal fibroid. There is a 2.4 x 2.3 x 2.1 cm left fundal fibroid. Endometrium Thickness: 5 mm.  No focal abnormality visualized. Right ovary Measurements: 2.9 x 1.7 x 2.2 cm. Normal appearance/no adnexal mass. Left ovary Measurements: 4.1 x 2.1 x 3.0 cm. There is a 2.2 x 2.3 x 2.2 cm cyst in the left ovary. Pulsed Doppler evaluation of both ovaries demonstrates normal low-resistance arterial and venous waveforms. Other findings No abnormal free fluid. IMPRESSION: 1. Bilateral ovarian Doppler detected arterial and venous flow. 2. Mildly enlarged and myomatous uterus. 3. Unremarkable  endometrium. Electronically Signed   By: Anner Crete M.D.   On: 09/24/2017 03:59    ____________________________________________   PROCEDURES  Critical Care performed: No   Procedure(s) performed:   Procedures   ____________________________________________   INITIAL IMPRESSION / ASSESSMENT AND PLAN / ED COURSE  As part of my medical decision making, I reviewed the following data within the [place] notes reviewed and incorporated, Labs reviewed  and Notes from prior ED visits    As before, the differential diagnosis includes, but is not limited to, ovarian cyst, ovarian torsion, acute appendicitis, diverticulitis, urinary tract infection/pyelonephritis, endometriosis, bowel obstruction, colitis, renal colic, gastroenteritis, hernia, pregnancy related pain including ectopic pregnancy, etc. however, her urine pregnancy test is negative.  Urinalysis indicates blood which is most likely vaginal.  There is no indication for blood work at this time.  Her vital signs are stable she is afebrile.  Given the persistent symptoms and discomfort as well as now with irregular menstrual bleeding I offered ultrasound both to rule out emergent causes such as torsion but to evaluate for fibroids or any other abnormal explain her symptoms.  She is comfortable with the plan for discharge and outpatient follow-up.  Given the chronic nature of her symptoms I will not treat with narcotics but will recommend NSAIDs.  I also discussed whether or not she may benefit from oral contraceptives but she may wait to follow-up with GYN before starting this.    Clinical Course as of Sep 25 503  Wed Sep 24, 2017  2122 Ultrasound demonstrates no acute or emergent process.  I updated the patient with the results.  She is going to follow-up with Dr. Marcelline Mates with [facility] at the next available opportunity.  I wrote her a prescription for Sprintec which she will only fill if she is not able  to see Dr. Marcelline Mates relatively soon.  Gave my usual and customary return precautions. US Pelvis Complete [CF]    Clinical Course User Index [CF] Hinda Kehr, MD    ____________________________________________  FINAL CLINICAL IMPRESSION(S) / ED DIAGNOSES  Final diagnoses:  Pelvic pain in female  Menometrorrhagia     MEDICATIONS GIVEN DURING THIS VISIT:  Medications - No data to display   NEW OUTPATIENT MEDICATIONS STARTED DURING THIS VISIT:  New Prescriptions   NORGESTIMATE-ETHINYL ESTRADIOL (ORTHO-CYCLEN,SPRINTEC,PREVIFEM) 0.25-35 MG-MCG TABLET    Take 1 tablet by mouth daily.    Modified Medications   No medications on file  Discontinued Medications   No medications on file     Note:  This document was prepared using Dragon voice recognition software and may include unintentional dictation errors.    Hinda Kehr, MD 09/24/17 [phone]

## 2017-09-26 ENCOUNTER — Encounter: Payer: Self-pay | Admitting: Emergency Medicine

## 2017-09-26 NOTE — ED Provider Notes (Addendum)
-----------------------------------------   10:27 AM on 09/26/2017 -----------------------------------------  The patient called and left a message for Kris Mouton regarding her results.  She explained that I told her she did not have fibroids, but she read the ultrasound report on MyChart which indicates that she does.  Ms. Narda Amber let me know, so I called the patient last night and again this morning and left HIPAA-compliant voicemails to let her know I called to try and offer additional insight and explanation.  I will continue to try and reach her by phone.   ----------------------------------------- 2:24 PM on 09/26/2017 -----------------------------------------  I have called the patient two more times with no answer (did not leave messages these times).  I then remembered that I could send a message through [facility] to her directly, so I did so, confirming that what she saw in the MyChart report was correct (she has three fibroids that are about 2-cm in diameter).  I provided some background information on fibroids and included in the message the [facility] discharge instructions for Uterine Fibroids.  I explained that my management recommendations were the same as before, and that I still encourage her to follow up with an OB/GYN.  I encouraged her to message me back if she has any additional questions or concerns.     ----------------------------------------- 11:18 AM on 09/28/2017 -----------------------------------------  Have not heard back through [facility] that the patient read my Patient Message.  I tried one more time just now (on a Sunday) to reach her by phone but again there was no answer.  I did not leave another message out of concerns for HIPAA compliance.    ----------------------------------------- 3:20 PM on 09/28/2017 -----------------------------------------  Just spoke with patient by phone.  She sounds good and says she is "managing" her symptoms.  I answered a few other  questions she had, and she already has a follow up plan in place with her regular doctor.  I reiterated my recommendation to see an OB/GYN.  She understands and agrees with the plan.   Hinda Kehr, MD 09/28/17 [phone]

## 2018-10-10 ENCOUNTER — Emergency Department
Admission: EM | Admit: 2018-10-10 | Discharge: 2018-10-11 | Disposition: A | Discharge status: Home / Self Care | Payer: BLUE CROSS/BLUE SHIELD | Attending: Emergency Medicine | Admitting: Emergency Medicine

## 2018-10-10 ENCOUNTER — Other Ambulatory Visit: Payer: Self-pay

## 2018-10-10 ENCOUNTER — Emergency Department: Payer: BLUE CROSS/BLUE SHIELD

## 2018-10-10 DIAGNOSIS — O0380 Unspecified complication following complete or unspecified spontaneous abortion: Secondary | ICD-10-CM | POA: Insufficient documentation

## 2018-10-10 DIAGNOSIS — Z3A00 Weeks of gestation of pregnancy not specified: Secondary | ICD-10-CM | POA: Diagnosis not present

## 2018-10-10 LAB — COMPREHENSIVE METABOLIC PANEL
ALK PHOS: 47 U/L (ref 38–126)
ALT: 16 U/L (ref 0–44)
AST: 23 U/L (ref 15–41)
Albumin: 4.3 g/dL (ref 3.5–5.0)
Anion gap: 11 (ref 5–15)
BILIRUBIN TOTAL: 0.6 mg/dL (ref 0.3–1.2)
BUN: 10 mg/dL (ref 6–20)
CALCIUM: 9.4 mg/dL (ref 8.9–10.3)
CO2: 21 mmol/L — ABNORMAL LOW (ref 22–32)
CREATININE: 0.62 mg/dL (ref 0.44–1.00)
Chloride: 103 mmol/L (ref 98–111)
Glucose, Bld: 91 mg/dL (ref 70–99)
Potassium: 3.8 mmol/L (ref 3.5–5.1)
Sodium: 135 mmol/L (ref 135–145)
TOTAL PROTEIN: 8.3 g/dL — AB (ref 6.5–8.1)

## 2018-10-10 LAB — URINALYSIS, COMPLETE (UACMP) WITH MICROSCOPIC
BACTERIA UA: NONE SEEN
BILIRUBIN URINE: NEGATIVE
Glucose, UA: NEGATIVE mg/dL
HGB URINE DIPSTICK: NEGATIVE
Ketones, ur: 20 mg/dL — AB
LEUKOCYTES UA: NEGATIVE
NITRITE: NEGATIVE
PH: 5 (ref 5.0–8.0)
Protein, ur: 30 mg/dL — AB
SPECIFIC GRAVITY, URINE: 1.03 (ref 1.005–1.030)

## 2018-10-10 LAB — CBC
HEMATOCRIT: 38.9 % (ref 36.0–46.0)
Hemoglobin: 12.6 g/dL (ref 12.0–15.0)
MCH: 27.2 pg (ref 26.0–34.0)
MCHC: 32.4 g/dL (ref 30.0–36.0)
MCV: 83.8 fL (ref 80.0–100.0)
PLATELETS: 394 10*3/uL (ref 150–400)
RBC: 4.64 MIL/uL (ref 3.87–5.11)
RDW: 15.9 % — AB (ref 11.5–15.5)
WBC: 9.2 10*3/uL (ref 4.0–10.5)
nRBC: 0 % (ref 0.0–0.2)

## 2018-10-10 LAB — HCG, QUANTITATIVE, PREGNANCY: HCG, BETA CHAIN, QUANT, S: 58709 m[IU]/mL — AB (ref <5)

## 2018-10-10 LAB — LIPASE, BLOOD: LIPASE: 27 U/L (ref 11–51)

## 2018-10-10 MED ORDER — OXYCODONE-ACETAMINOPHEN 5-325 MG PO TABS
2.0000 | ORAL_TABLET | Freq: Once | ORAL | Status: AC
  Administered 2018-10-10: 2 via ORAL
  Filled 2018-10-10: qty 2

## 2018-10-10 MED ORDER — OXYCODONE-ACETAMINOPHEN 5-325 MG PO TABS: 1.0000 | ORAL_TABLET | Freq: Three times a day (TID) | ORAL | 0 refills | Status: AC | PRN

## 2018-10-10 NOTE — ED Triage Notes (Addendum)
Pt took abortion pill yesterday, N&V&D began. Today took second abortion pill and started having lower abd pain. Took 800mg  ibuprofen. Vaginal bleeding, pt state she thinks he passed most of the tissue from taking abortion pills. A&O, ambulatory. No distress noted. States took abortion pill d/t fibroids.

## 2018-10-10 NOTE — ED Provider Notes (Signed)
[facility] Emergency Department Provider Note       Time seen: ----------------------------------------- 9:13 PM on 10/10/2018 -----------------------------------------   I have reviewed the triage vital signs and the nursing notes.  HISTORY   Chief Complaint Abdominal Pain    HPI Kim Barker is a 37 y.o. female with a history of allergies, breast cancer, breast mass, GERD who presents to the ED for nausea with vomiting and diarrhea after she took an abortion pill yesterday.  Patient states she is G4 P2 AB 1 with a planned abortion for this pregnancy.  Patient has been taking Motrin without any improvement for her pain.  Patient thinks she passed much the tissue from taking the abortion pills.  She denies fevers, chills or other complaints.  Past Medical History:  Diagnosis Date  . Allergy   . Breast cancer, left ([place])    Pt had partial mastectomy 02/20/2017. 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 Breast mass    left breast present x 2 months  . GERD (gastroesophageal reflux disease)    PT WAS ON ACID REDUCER BUT HAS NOT TAKEN IN MONTHS  . Headache    MIGRAINES    Patient Active Problem List   Diagnosis Date Noted  . Fibroadenoma of both breasts 03/04/2017  . Left breast mass 02/12/2017  . Phyllodes tumor of breast 02/09/2017  . Umbilical hernia without obstruction or gangrene 03/21/2016  . Sciatica 04/10/2012    Past Surgical History:  Procedure Laterality Date  . BREAST BIOPSY Right 02/20/2017   Procedure: BREAST BIOPSY;  Surgeon: Robert Bellow, MD;  Location: [facility] ORS;  Service: General;  Laterality: Right;  . BREAST LUMPECTOMY Right 37 years old   Benign  . BREAST RECONSTRUCTION WITH MASTOPLASTY Left 02/20/2017   Procedure: BREAST RECONSTRUCTION WITH MASTOPLASTY;  Surgeon: Robert Bellow, MD;  Location: [facility] ORS;  Service: General;  Laterality: Left;  . HYMENPLASTY  37 years old  .  MASTECTOMY, PARTIAL Left 02/20/2017   Procedure: MASTECTOMY PARTIAL;  Surgeon: Robert Bellow, MD;  Location: [facility] ORS;  Service: General;  Laterality: Left;  . WISDOM TOOTH EXTRACTION  2004    Allergies Patient has no known allergies.  Social History Social History   Tobacco Use  . Smoking status: Never Smoker  . Smokeless tobacco: Never Used  Substance Use Topics  . Alcohol use: Yes    Comment: OCC  . Drug use: No   Review of Systems Constitutional: Negative for fever. Cardiovascular: Negative for chest pain. Respiratory: Negative for shortness of breath. Gastrointestinal: Positive for abdominal pain Genitourinary: Positive for vaginal bleeding and cramping Musculoskeletal: Negative for back pain. Skin: Negative for rash. Neurological: Negative for headaches, focal weakness or numbness.  All systems negative/normal/unremarkable except as stated in the HPI  ____________________________________________   PHYSICAL EXAM:  VITAL SIGNS: ED Triage Vitals [10/10/18 1800]  Enc Vitals Group     BP 129/62     Pulse Rate 94     Resp 18     Temp 98.2 F (36.8 C)     Temp Source Oral     SpO2 100 %     Weight 210 lb (95.3 kg)     Height 5\' 4"  (1.626 m)     Head Circumference      Peak Flow      Pain Score 9     Pain Loc      Pain Edu?      Excl.  in [place]?    Constitutional: Alert and oriented. Well appearing and in no distress. Cardiovascular: Normal rate, regular rhythm. No murmurs, rubs, or gallops. Respiratory: Normal respiratory effort without tachypnea nor retractions. Breath sounds are clear and equal bilaterally. No wheezes/rales/rhonchi. Gastrointestinal: Suprapubic and pelvic tenderness, no rebound or guarding.  Normal bowel sounds. Musculoskeletal: Nontender with normal range of motion in extremities. No lower extremity tenderness nor edema. Neurologic:  Normal speech and language. No gross focal neurologic deficits are appreciated.  Skin:  Skin is warm, dry  and intact. No rash noted. Psychiatric: Mood and affect are normal. Speech and behavior are normal.  ____________________________________________  ED COURSE:  As part of my medical decision making, I reviewed the following data within the [place] History obtained from family if available, nursing notes, old chart and ekg, as well as notes from prior ED visits. Patient presented for abdominal pain and bleeding after taking "abortion pills", we will assess with labs and imaging as indicated at this time. Clinical Course as of Oct 10 2322  Sat Oct 10, 2018  2126 Blood type is O+   [JW]    Clinical Course User Index [JW] Earleen Newport, MD   Procedures ____________________________________________   LABS (pertinent positives/negatives)  Labs Reviewed  COMPREHENSIVE METABOLIC PANEL - Abnormal; Notable for the following components:      Result Value   CO2 21 (*)    Total Protein 8.3 (*)    All other components within normal limits  CBC - Abnormal; Notable for the following components:   RDW 15.9 (*)    All other components within normal limits  URINALYSIS, COMPLETE (UACMP) WITH MICROSCOPIC - Abnormal; Notable for the following components:   Color, Urine YELLOW (*)    APPearance CLEAR (*)    Ketones, ur 20 (*)    Protein, ur 30 (*)    All other components within normal limits  HCG, QUANTITATIVE, PREGNANCY - Abnormal; Notable for the following components:   hCG, Beta Chain, Quant, S 58,709 (*)    All other components within normal limits  LIPASE, BLOOD  POC URINE PREG, ED    RADIOLOGY Images were viewed by me  Pregnancy ultrasound IMPRESSION: No visible intrauterine gestation.  Uterine fibroids.  ____________________________________________  DIFFERENTIAL DIAGNOSIS   Medication side effect, ectopic pregnancy, UTI, retained products of conception  FINAL ASSESSMENT AND PLAN  Abdominal pain, abortion   Plan: The patient had presented for  symptoms secondary to recently taking medications to terminate her pregnancy. Patient's labs are unremarkable. Patient's imaging not reveal an intrauterine gestation.  Her symptoms are likely secondary to the medication induced abortion.  She will be discharged with mild pain medicine and is cleared for outpatient follow-up.   Laurence Aly, MD   Note: This note was generated in part or whole with voice recognition software. Voice recognition is usually quite accurate but there are transcription errors that can and very often do occur. I apologize for any typographical errors that were not detected and corrected.     Earleen Newport, MD 10/10/18 [phone]

## 2018-12-23 ENCOUNTER — Other Ambulatory Visit (HOSPITAL_COMMUNITY)
Admission: RE | Admit: 2018-12-23 | Discharge: 2018-12-23 | Disposition: A | Discharge status: Home / Self Care | Payer: BLUE CROSS/BLUE SHIELD | Source: Ambulatory Visit | Attending: Advanced Practice Midwife | Admitting: Advanced Practice Midwife

## 2018-12-23 ENCOUNTER — Ambulatory Visit (INDEPENDENT_AMBULATORY_CARE_PROVIDER_SITE_OTHER): Payer: BLUE CROSS/BLUE SHIELD | Admitting: Advanced Practice Midwife

## 2018-12-23 ENCOUNTER — Encounter: Payer: Self-pay | Admitting: Advanced Practice Midwife

## 2018-12-23 VITALS — BP 128/74 | Ht 64.0 in | Wt 212.0 lb

## 2018-12-23 DIAGNOSIS — Z124 Encounter for screening for malignant neoplasm of cervix: Secondary | ICD-10-CM

## 2018-12-23 DIAGNOSIS — N898 Other specified noninflammatory disorders of vagina: Secondary | ICD-10-CM

## 2018-12-23 DIAGNOSIS — Z01419 Encounter for gynecological examination (general) (routine) without abnormal findings: Secondary | ICD-10-CM

## 2018-12-23 DIAGNOSIS — Z113 Encounter for screening for infections with a predominantly sexual mode of transmission: Secondary | ICD-10-CM | POA: Insufficient documentation

## 2018-12-23 MED ORDER — FLUCONAZOLE 150 MG PO TABS: 150.0000 mg | ORAL_TABLET | Freq: Once | ORAL | 1 refills | Status: AC

## 2018-12-23 NOTE — Patient Instructions (Addendum)
Vaginitis  Vaginitis is a condition in which the vaginal tissue swells and becomes red (inflamed). This condition is most often caused by a change in the normal balance of bacteria and yeast that live in the vagina. This change causes an overgrowth of certain bacteria or yeast, which causes the inflammation. There are different types of vaginitis, but the most common types are:   Bacterial vaginosis.   Yeast infection (candidiasis).   Trichomoniasis vaginitis. This is a sexually transmitted disease (STD).   Viral vaginitis.   Atrophic vaginitis.   Allergic vaginitis.  What are the causes?  The cause of this condition depends on the type of vaginitis. It can be caused by:   Bacteria (bacterial vaginosis).   Yeast, which is a fungus (yeast infection).   A parasite (trichomoniasis vaginitis).   A virus (viral vaginitis).   Low hormone levels (atrophic vaginitis). Low hormone levels can occur during pregnancy, breastfeeding, or after menopause.   Irritants, such as bubble baths, scented tampons, and feminine sprays (allergic vaginitis).  Other factors can change the normal balance of the yeast and bacteria that live in the vagina. These include:   Antibiotic medicines.   Poor hygiene.   Diaphragms, vaginal sponges, spermicides, birth control pills, and intrauterine devices (IUD).   Sex.   Infection.   Uncontrolled diabetes.   A weakened defense (immune) system.  What increases the risk?  This condition is more likely to develop in women who:   Smoke.   Use vaginal douches, scented tampons, or scented sanitary pads.   Wear tight-fitting pants.   Wear thong underwear.   Use oral birth control pills or an IUD.   Have sex without a condom.   Have multiple sex partners.   Have an STD.   Frequently use the spermicide nonoxynol-9.   Eat lots of foods high in sugar.   Have uncontrolled diabetes.   Have low estrogen levels.   Have a weakened immune system from an immune disorder or medical  treatment.   Are pregnant or breastfeeding.  What are the signs or symptoms?  Symptoms vary depending on the cause of the vaginitis. Common symptoms include:   Abnormal vaginal discharge.  ? The discharge is white, gray, or yellow with bacterial vaginosis.  ? The discharge is thick, white, and cheesy with a yeast infection.  ? The discharge is frothy and yellow or greenish with trichomoniasis.   A bad vaginal smell. The smell is fishy with bacterial vaginosis.   Vaginal itching, pain, or swelling.   Sex that is painful.   Pain or burning when urinating.  Sometimes there are no symptoms.  How is this diagnosed?  This condition is diagnosed based on your symptoms and medical history. A physical exam, including a pelvic exam, will also be done. You may also have other tests, including:   Tests to determine the pH level (acidity or alkalinity) of your vagina.   A whiff test, to assess the odor that results when a sample of your vaginal discharge is mixed with a potassium hydroxide solution.   Tests of vaginal fluid. A sample will be examined under a microscope.  How is this treated?  Treatment varies depending on the type of vaginitis you have. Your treatment may include:   Antibiotic creams or pills to treat bacterial vaginosis and trichomoniasis.   Antifungal medicines, such as vaginal creams or suppositories, to treat a yeast infection.   Medicine to ease discomfort if you have viral vaginitis. Your sexual partner   should also be treated.   Estrogen delivered in a cream, pill, suppository, or vaginal ring to treat atrophic vaginitis. If vaginal dryness occurs, lubricants and moisturizing creams may help. You may need to avoid scented soaps, sprays, or douches.   Stopping use of a product that is causing allergic vaginitis. Then using a vaginal cream to treat the symptoms.  Follow these instructions at home:  Lifestyle   Keep your genital area clean and dry. Avoid soap, and only rinse the area with  water.   Do not douche or use tampons until your health care provider says it is okay to do so. Use sanitary pads, if needed.   Do not have sex until your health care provider approves. When you can return to sex, practice safe sex and use condoms.   Wipe from front to back. This avoids the spread of bacteria from the rectum to the vagina.  General instructions   Take over-the-counter and prescription medicines only as told by your health care provider.   If you were prescribed an antibiotic medicine, take or use it as told by your health care provider. Do not stop taking or using the antibiotic even if you start to feel better.   Keep all follow-up visits as told by your health care provider. This is important.  How is this prevented?   Use mild, non-scented products. Do not use things that can irritate the vagina, such as fabric softeners. Avoid the following products if they are scented:  ? Feminine sprays.  ? Detergents.  ? Tampons.  ? Feminine hygiene products.  ? Soaps or bubble baths.   Let air reach your genital area.  ? Wear cotton underwear to reduce moisture buildup.  ? Avoid wearing underwear while you sleep.  ? Avoid wearing tight pants and underwear or nylons without a cotton panel.  ? Avoid wearing thong underwear.   Take off any wet clothing, such as bathing suits, as soon as possible.   Practice safe sex and use condoms.  Contact a health care provider if:   You have abdominal pain.   You have a fever.   You have symptoms that last for more than 2-3 days.  Get help right away if:   You have a fever and your symptoms suddenly get worse.  Summary   Vaginitis is a condition in which the vaginal tissue becomes inflamed.This condition is most often caused by a change in the normal balance of bacteria and yeast that live in the vagina.   Treatment varies depending on the type of vaginitis you have.   Do not douche, use tampons , or have sex until your health care provider approves. When  you can return to sex, practice safe sex and use condoms.  This information is not intended to replace advice given to you by your health care provider. Make sure you discuss any questions you have with your health care provider.  Document Released: 09/08/2007 Document Revised: 12/17/2016 Document Reviewed: 12/17/2016  Elsevier Interactive Patient Education  2019 [employer]vier Inc[employer]

## 2018-12-23 NOTE — Progress Notes (Signed)
Patient ID: Kim Barker, female   DOB: 1981/05/31, 38 y.o.   MRN: [id number]     Gynecology Annual Exam   PCP: Gennette Pac, FNP  Chief Complaint:  Chief Complaint  Patient presents with  . Annual Exam    History of Present Illness: Patient is a 38 y.o. [id number] presents for annual exam. The patient has complaint today of vaginal discharge with some irritation and mild itching. She has a history of yeast infection. She has noticed the symptoms for the past week. She also has pain with intercourse for the past year. She admits some dryness and uses lubrication. She has a history of uterine fibroid with enlarged uterus and a history of irregular bleeding, but currently no irregular bleeding. She reports symptoms are mild at this time and she declines follow up imaging at this time. She is encouraged to let us know of any worsening of symptoms. 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 She has follow up with oncology and is currently due for her 6 month follow up.    LMP: Patient's last menstrual period was 12/10/2018. Average Interval: regular, 28 days Duration of flow: 4-5 days Heavy Menses: heavy first few days, then light Clots: no Intermenstrual Bleeding: no Postcoital Bleeding: no Dysmenorrhea: mild  The patient is sexually active. She currently uses condoms and pull out for contraception. She admits to dyspareunia.  The patient does perform self breast exams.  There is notable family history of breast or ovarian cancer in her family. The patient has history of malignant tumor of left breast. No other known family history.  The patient wears seatbelts: yes.   The patient has regular exercise: She goes to gym 3 days per week for cardio and strength/stretching. She is currently eating a no-carb diet, lean meats and fruits/vegetables. Discussed adding some whole grains. She admits adequate hydration. She denies adequate sleep.    The patient  denies current symptoms of depression. She has anxiety related to breast cancer diagnosis and sees a therapist for that and sleep issues.    Review of Systems: Review of Systems  Constitutional: Positive for malaise/fatigue.  HENT: Negative.   Eyes: Negative.   Respiratory: Positive for cough.        Sneezing   Cardiovascular: Negative.   Gastrointestinal: Positive for nausea.  Genitourinary:       Vaginal discharge, pain during sex  Musculoskeletal: Negative.   Skin: Negative.   Neurological: Negative.   Endo/Heme/Allergies: Positive for environmental allergies.  Psychiatric/Behavioral:       Anxiety     Past Medical History:  Past Medical History:  Diagnosis Date  . Allergy   . Breast cancer, left ([place])    Pt had partial mastectomy 02/20/2017. 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 Breast mass    left breast present x 2 months  . GERD (gastroesophageal reflux disease)    PT WAS ON ACID REDUCER BUT HAS NOT TAKEN IN MONTHS  . Headache    MIGRAINES    Past Surgical History:  Past Surgical History:  Procedure Laterality Date  . BREAST BIOPSY Right 02/20/2017   Procedure: BREAST BIOPSY;  Surgeon: Robert Bellow, MD;  Location: [facility] ORS;  Service: General;  Laterality: Right;  . BREAST LUMPECTOMY Right 38 years old   Benign  . BREAST RECONSTRUCTION WITH MASTOPLASTY Left 02/20/2017   Procedure: BREAST RECONSTRUCTION WITH MASTOPLASTY;  Surgeon: Robert Bellow, MD;  Location: [facility] ORS;  Service: General;  Laterality: Left;  . HYMENPLASTY  38 years old  . MASTECTOMY, PARTIAL Left 02/20/2017   Procedure: MASTECTOMY PARTIAL;  Surgeon: Robert Bellow, MD;  Location: [facility] ORS;  Service: General;  Laterality: Left;  . WISDOM TOOTH EXTRACTION  2004    Gynecologic History:  Patient's last menstrual period was 12/10/2018. Contraception: condoms and pull out Last Pap: uncertain date Results were: normal  Last Pap in chart was  2002/normal  Obstetric History: [id number]  Family History:  Family History  Problem Relation Age of Onset  . Diabetes Mother   . Asthma Father   . Diabetes Father   . Asthma Brother   . Diabetes Maternal Grandmother   . COPD Paternal Grandfather   . Heart disease Paternal Grandfather   . Breast cancer Maternal Aunt 62  . Breast cancer Maternal Aunt 60  . Breast cancer Maternal Aunt 67    Social History:  Social History   Socioeconomic History  . Marital status: Single    Spouse name: Not on file  . Number of children: Not on file  . Years of education: Not on file  . Highest education level: Not on file  Occupational History  . Not on file  Social Needs  . Financial resource strain: Not on file  . Food insecurity:    Worry: Not on file    Inability: Not on file  . Transportation needs:    Medical: Not on file    Non-medical: Not on file  Tobacco Use  . Smoking status: Never Smoker  . Smokeless tobacco: Never Used  Substance and Sexual Activity  . Alcohol use: Not Currently    Comment: OCC  . Drug use: No  . Sexual activity: Yes    Birth control/protection: None    Comment: LMP 03/04/2017  Lifestyle  . Physical activity:    Days per week: Not on file    Minutes per session: Not on file  . Stress: Not on file  Relationships  . Social connections:    Talks on phone: Not on file    Gets together: Not on file    Attends religious service: Not on file    Active member of club or organization: Not on file    Attends meetings of clubs or organizations: Not on file    Relationship status: Not on file  . Intimate partner violence:    Fear of current or ex partner: Not on file    Emotionally abused: Not on file    Physically abused: Not on file    Forced sexual activity: Not on file  Other Topics Concern  . Not on file  Social History Narrative  . Not on file    Allergies:  No Known Allergies  Medications: Prior to Admission medications   Medication Sig  Start Date End Date Taking? Authorizing Provider  HYDROcodone-acetaminophen (NORCO) 5-325 MG tablet Take 1-2 tablets by mouth every 4 (four) hours as needed for moderate pain. 02/20/17  Yes Byrnett, Forest Gleason, MD  ibuprofen (ADVIL,MOTRIN) 800 MG tablet Take 800 mg by mouth.   Yes [provider]  oxyCODONE-acetaminophen (PERCOCET) 5-325 MG tablet Take 1 tablet by mouth every 8 (eight) hours as needed. 10/10/18  Yes Earleen Newport, MD  diphenhydramine-acetaminophen (TYLENOL PM) 25-500 MG TABS tablet Take by mouth.    [provider]  Melatonin 5 MG CAPS Take by mouth. 1 at hs for sleep    [provider]  Physical Exam Vitals: Blood pressure 128/74, height 5\' 4"  (1.626 m), weight 212 lb (96.2 kg), last menstrual period 12/10/2018.  General: NAD HEENT: normocephalic, anicteric Thyroid: no enlargement, no palpable nodules Pulmonary: No increased work of breathing, CTAB Cardiovascular: RRR, distal pulses 2+ Breast: done at PCP/follow up with oncology scheduled Abdomen: NABS, soft, non-tender, non-distended.  Umbilicus without lesions.  No hepatomegaly, splenomegaly or masses palpable. No evidence of hernia  Genitourinary:   External: Normal external female genitalia.  Normal urethral meatus, normal Bartholin's and Skene's glands.    Vagina: Normal vaginal mucosa, no evidence of prolapse.    Cervix: Grossly normal in appearance, no bleeding, no CMT  Uterus: enlarged to palpation- possible 12 week size, (some limitation due to body habitus) mobile, normal contour.    Adnexa: ovaries non-enlarged, no adnexal masses, mild tenderness to palpation  Rectal: deferred  Lymphatic: no evidence of inguinal lymphadenopathy Extremities: no edema, erythema, or tenderness Neurologic: Grossly intact Psychiatric: mood appropriate, affect full    Assessment: 38 y.o. G2P2002 routine annual exam  Plan: Problem List Items Addressed This Visit    None    Visit Diagnoses      Well woman exam with routine gynecological exam    -  Primary   Relevant Orders   Cytology - PAP   Screen for sexually transmitted diseases       Relevant Orders   Cytology - PAP   Cervical cancer screening       Relevant Orders   Cytology - PAP   Vaginal discharge       Relevant Medications   fluconazole (DIFLUCAN) 150 MG tablet   Other Relevant Orders   Cytology - PAP   Vaginal irritation       Relevant Medications   fluconazole (DIFLUCAN) 150 MG tablet      1) STI screening  was offered and accepted  2)  ASCCP guidelines and rationale discussed.  Patient opts for today and every 3 years screening interval  3) Contraception - the patient is currently using  condoms and pull out.  She is happy with her current form of contraception and plans to continue  4) Routine healthcare maintenance including cholesterol, diabetes screening discussed managed by PCP   5) Please see After Visit Summary for other instructions  6) Return in 1 year (on 12/24/2019) for annual established gyn.    Rod Can, [facility], [facility] 12/23/2018, 10:10 AM

## 2018-12-25 LAB — CYTOLOGY - PAP
Bacterial vaginitis: NEGATIVE
CHLAMYDIA, DNA PROBE: NEGATIVE
Candida vaginitis: NEGATIVE
Diagnosis: NEGATIVE
HPV: NOT DETECTED
Neisseria Gonorrhea: NEGATIVE
Trichomonas: NEGATIVE

## 2019-01-11 ENCOUNTER — Encounter: Payer: Self-pay | Admitting: Obstetrics and Gynecology

## 2019-06-25 ENCOUNTER — Other Ambulatory Visit: Payer: Self-pay | Admitting: Physician Assistant

## 2019-06-25 DIAGNOSIS — R140 Abdominal distension (gaseous): Secondary | ICD-10-CM

## 2019-07-07 ENCOUNTER — Other Ambulatory Visit: Payer: Self-pay | Admitting: Physician Assistant

## 2019-07-07 DIAGNOSIS — R140 Abdominal distension (gaseous): Secondary | ICD-10-CM

## 2019-07-08 ENCOUNTER — Other Ambulatory Visit: Payer: Self-pay

## 2019-07-08 ENCOUNTER — Ambulatory Visit
Admission: RE | Admit: 2019-07-08 | Discharge: 2019-07-08 | Disposition: A | Discharge status: Home / Self Care | Payer: BLUE CROSS/BLUE SHIELD | Source: Ambulatory Visit | Attending: Physician Assistant | Admitting: Physician Assistant

## 2019-07-08 DIAGNOSIS — R140 Abdominal distension (gaseous): Secondary | ICD-10-CM

## 2019-07-08 MED ORDER — IOHEXOL 300 MG/ML  SOLN
100.0000 mL | Freq: Once | INTRAMUSCULAR | Status: AC | PRN
  Administered 2019-07-08: 100 mL via INTRAVENOUS

## 2019-12-02 ENCOUNTER — Ambulatory Visit: Payer: BLUE CROSS/BLUE SHIELD | Admitting: Student in an Organized Health Care Education/Training Program

## 2019-12-25 IMAGING — CT CT ABDOMEN AND PELVIS WITH CONTRAST
2 of 4 series · 13 of 46 positions shown, 15 images · IV contrast (omnipaque)
Comparison: CT the chest, abdomen and pelvis 03/17/2017.

CLINICAL DATA: 37-year-old female with worsening mid epigastric
pain with nausea, vomiting and abdominal bloating for the past 3
months. Additional history of left-sided breast cancer.

EXAM:
CT CHEST, ABDOMEN, AND PELVIS WITH CONTRAST
TECHNIQUE: Multidetector CT imaging of the chest, abdomen and pelvis was
performed following the standard protocol during bolus
administration of intravenous contrast.
CONTRAST:  100mL OMNIPAQUE IOHEXOL 300 MG/ML  SOLN

[Series 2: axials cap · axial · 0.70mm/px · z∈[-1554,-989]mm · 10 of 131 slices shown, 12 images]
[im 9/131  soft-tissue]
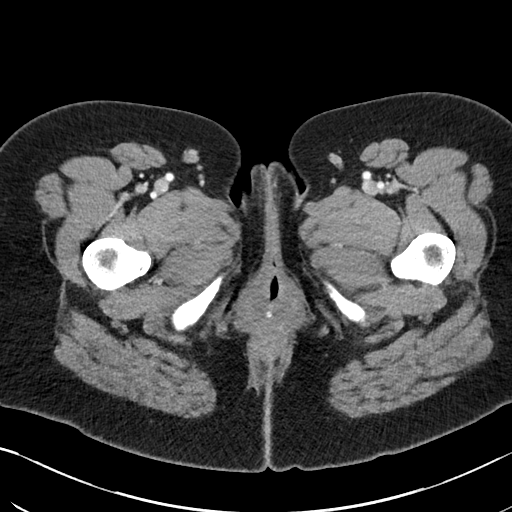
[im 9/131  bone]
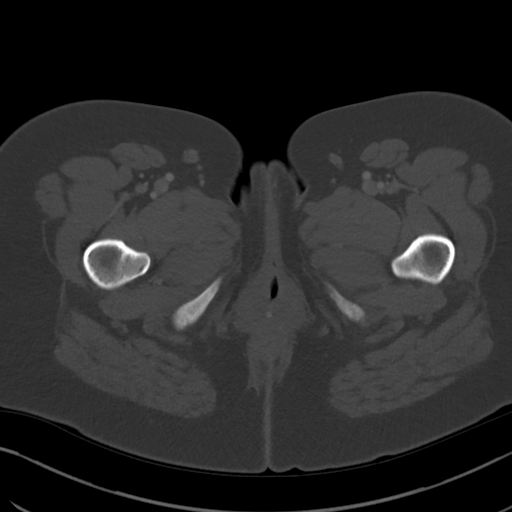
[im 27/131  soft-tissue]
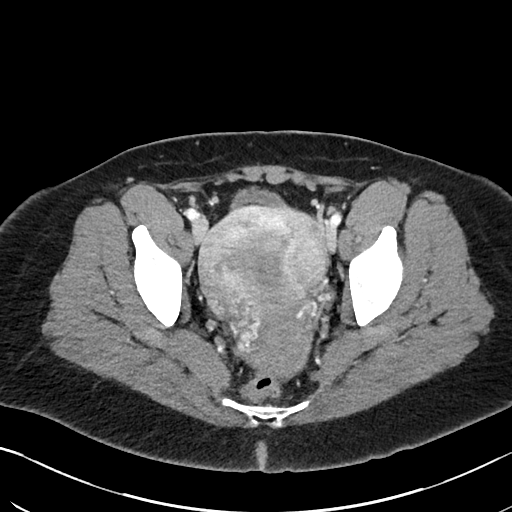
[im 35/131  soft-tissue]
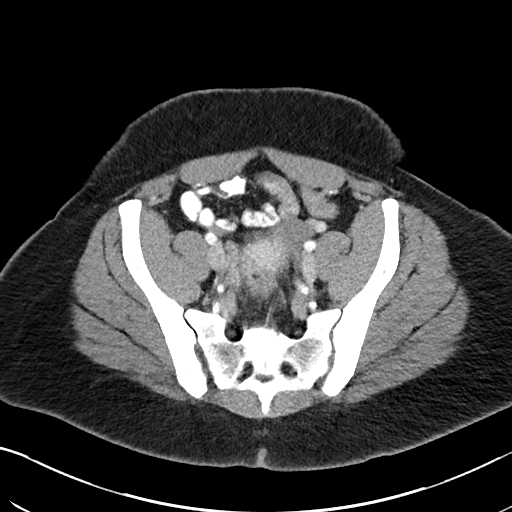
[im 44/131  soft-tissue]
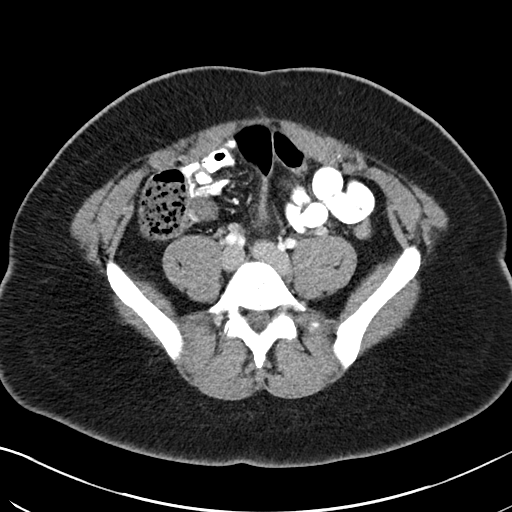
[im 61/131  soft-tissue]
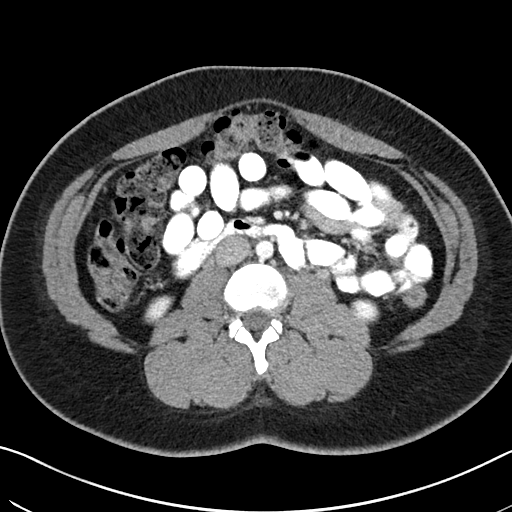
[im 70/131  soft-tissue]
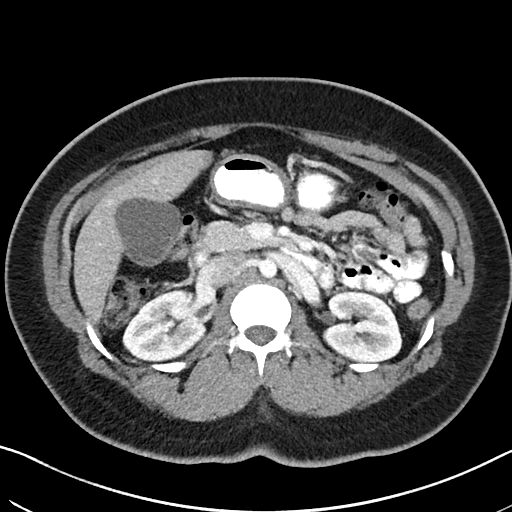
[im 87/131  soft-tissue]
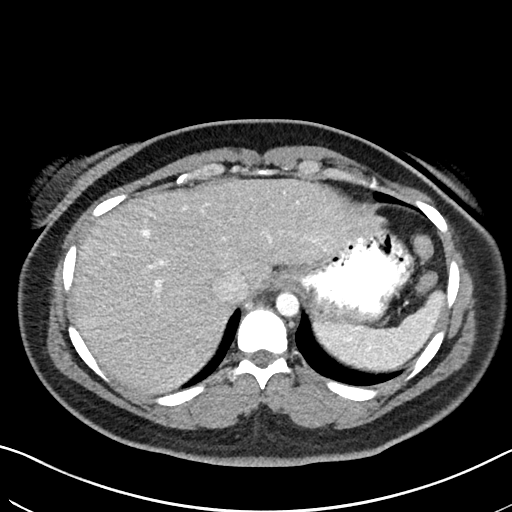
[im 96/131  soft-tissue]
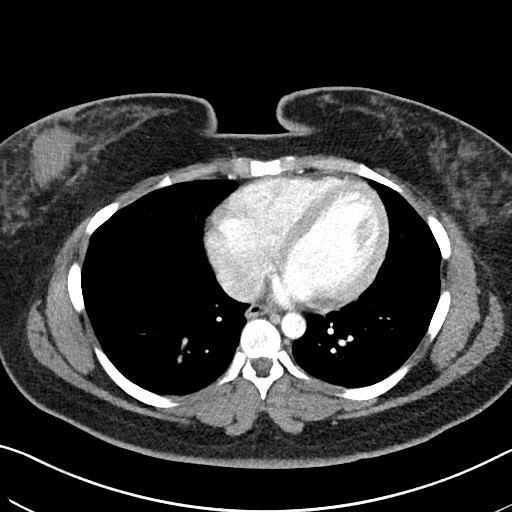
[im 105/131  soft-tissue]
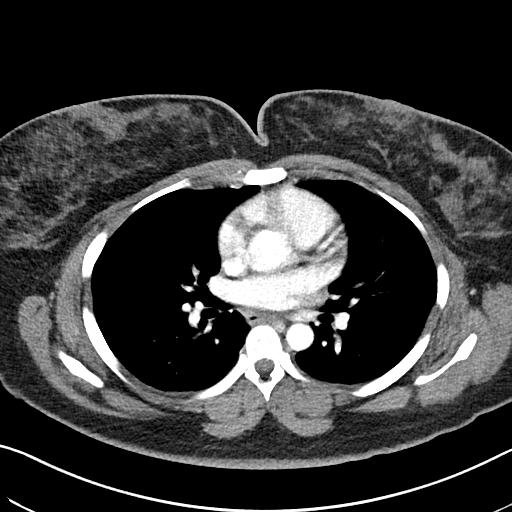
[im 105/131  bone]
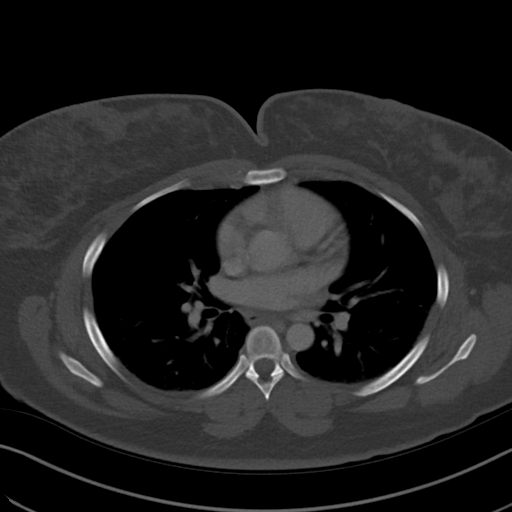
[im 122/131  soft-tissue]
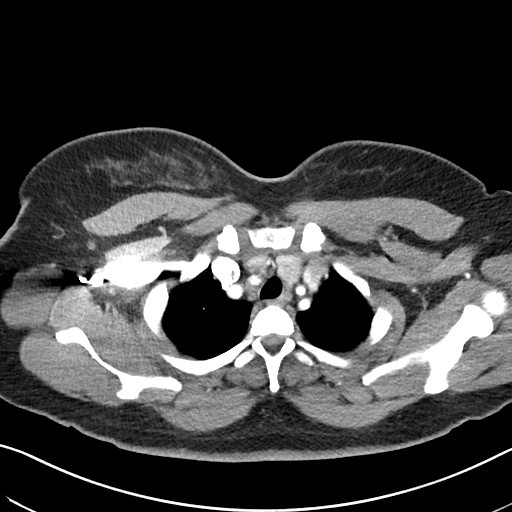

[Series 4: coronals cap · coronal · 0.70mm/px · 3 of 147 slices shown]
[im 49/147  soft-tissue]
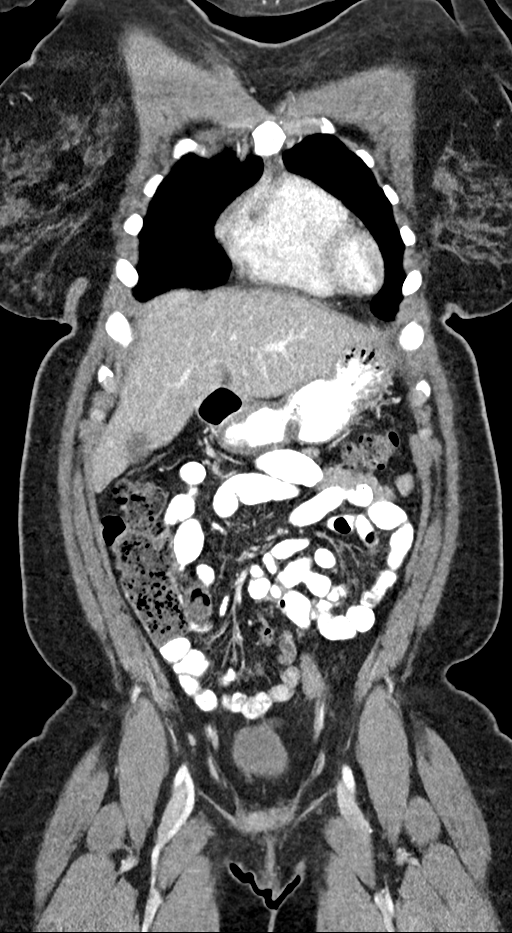
[im 65/147  soft-tissue]
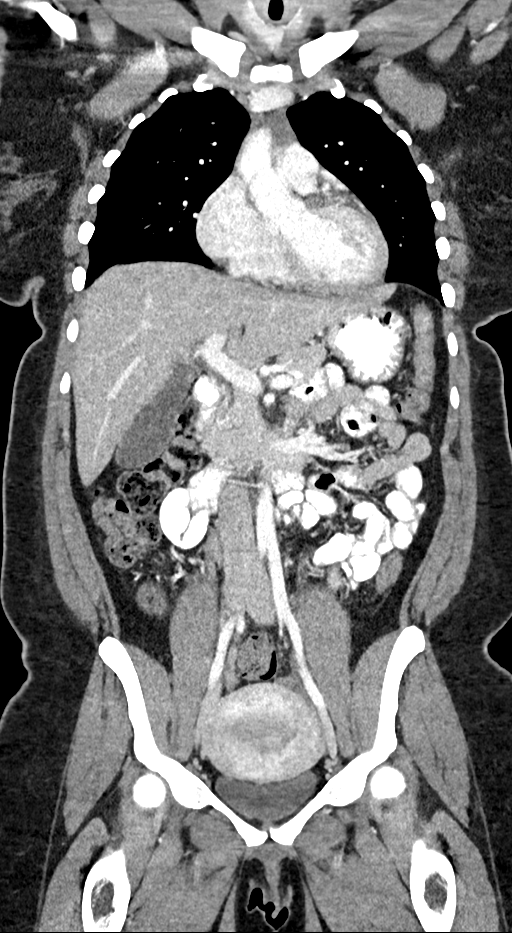
[im 82/147  soft-tissue]
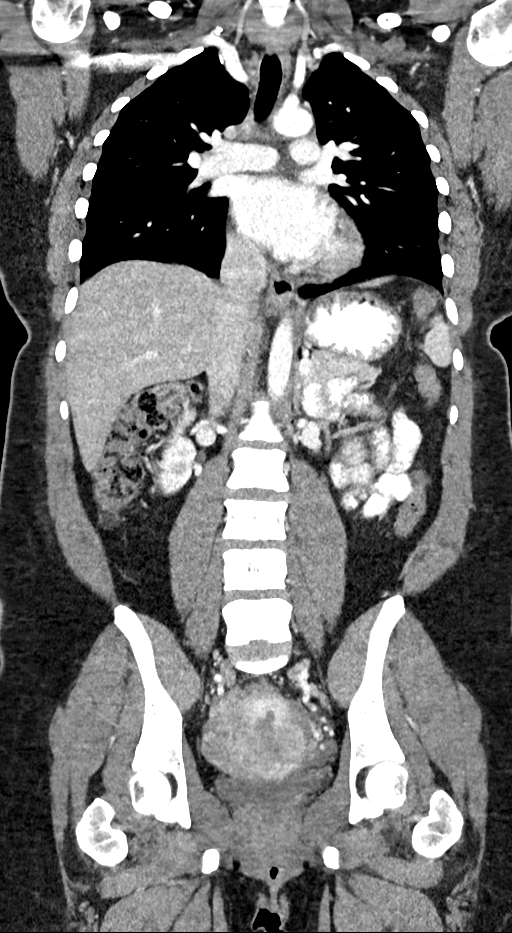

[13 of 46 positions shown; findings below may reference images not displayed]

FINDINGS: CT CHEST FINDINGS

Cardiovascular: Heart size is normal. There is no significant
pericardial fluid, thickening or pericardial calcification. No
atherosclerotic calcifications in the thoracic aorta or the coronary
arteries.

Mediastinum/Nodes: No pathologically enlarged mediastinal or hilar
lymph nodes. Esophagus is unremarkable in appearance. No axillary
lymphadenopathy.

Lungs/Pleura: No suspicious pulmonary nodules or masses. No acute
consolidative airspace disease. No pleural effusions.

Musculoskeletal: In the right breast to there is a 4.4 x 3.0 x
cm soft tissue attenuation mass, similar to prior studies dating
back to 5415 (axial image 34 of series 2 and sagittal image 19 of
series 6). There are no aggressive appearing lytic or blastic
lesions noted in the visualized portions of the skeleton.

CT ABDOMEN PELVIS FINDINGS

Hepatobiliary: No suspicious cystic or solid hepatic lesions. No
intra or extrahepatic biliary ductal dilatation. Gallbladder is
normal in appearance.

Pancreas: No pancreatic mass. No pancreatic ductal dilatation. No
pancreatic or peripancreatic fluid collections or inflammatory
changes.

Spleen: Unremarkable.

Adrenals/Urinary Tract: Bilateral kidneys and adrenal glands are
normal in appearance. No hydroureteronephrosis. Urinary bladder is
normal in appearance.

Stomach/Bowel: Normal appearance of the stomach. No pathologic
dilatation of small bowel or colon. Normal appendix.

Vascular/Lymphatic: No significant atherosclerotic disease, aneurysm
or dissection noted in the abdominal or pelvic vasculature. No
lymphadenopathy noted in the abdomen or pelvis.

Reproductive: Uterus is mildly enlarged and heterogeneous in
appearance, suggesting the presence of multiple small fibroids.
Ovaries are unremarkable in appearance.

Other: No significant volume of ascites.  No pneumoperitoneum.

Musculoskeletal: There are no aggressive appearing lytic or blastic
lesions noted in the visualized portions of the skeleton.
IMPRESSION: 1. No acute findings are noted in the chest, abdomen or pelvis to
account for the patient's symptoms.
2. Appearance the uterus suggest the presence of multiple small
fibroids.
3. Stable appearance of 4.4 x 3.0 x 3.5 cm soft tissue attenuation
mass in the right breast compared to prior study from 5415.

## 2020-04-20 ENCOUNTER — Ambulatory Visit: Payer: Medicaid Other | Attending: Neurology

## 2020-04-20 DIAGNOSIS — R0683 Snoring: Secondary | ICD-10-CM | POA: Insufficient documentation

## 2020-04-21 ENCOUNTER — Other Ambulatory Visit: Payer: Self-pay

## 2022-04-26 ENCOUNTER — Emergency Department
Admission: EM | Admit: 2022-04-26 | Discharge: 2022-04-26 | Disposition: A | Discharge status: Home / Self Care | Payer: Medicaid Other | Attending: Emergency Medicine | Admitting: Emergency Medicine

## 2022-04-26 ENCOUNTER — Emergency Department: Payer: Medicaid Other

## 2022-04-26 ENCOUNTER — Encounter: Payer: Self-pay | Admitting: Emergency Medicine

## 2022-04-26 ENCOUNTER — Other Ambulatory Visit: Payer: Self-pay

## 2022-04-26 DIAGNOSIS — S79911A Unspecified injury of right hip, initial encounter: Secondary | ICD-10-CM | POA: Diagnosis present

## 2022-04-26 DIAGNOSIS — S7001XA Contusion of right hip, initial encounter: Secondary | ICD-10-CM | POA: Insufficient documentation

## 2022-04-26 DIAGNOSIS — Z853 Personal history of malignant neoplasm of breast: Secondary | ICD-10-CM | POA: Insufficient documentation

## 2022-04-26 DIAGNOSIS — M25561 Pain in right knee: Secondary | ICD-10-CM | POA: Insufficient documentation

## 2022-04-26 DIAGNOSIS — S7011XA Contusion of right thigh, initial encounter: Secondary | ICD-10-CM | POA: Diagnosis not present

## 2022-04-26 DIAGNOSIS — Y92410 Unspecified street and highway as the place of occurrence of the external cause: Secondary | ICD-10-CM | POA: Insufficient documentation
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
## 2023-01-03 DIAGNOSIS — G894 Chronic pain syndrome: Secondary | ICD-10-CM | POA: Diagnosis not present

## 2023-01-03 DIAGNOSIS — X58XXXD Exposure to other specified factors, subsequent encounter: Secondary | ICD-10-CM | POA: Diagnosis not present

## 2023-01-03 DIAGNOSIS — S72321K Displaced transverse fracture of shaft of right femur, subsequent encounter for closed fracture with nonunion: Secondary | ICD-10-CM | POA: Diagnosis not present

## 2023-01-03 DIAGNOSIS — S72351K Displaced comminuted fracture of shaft of right femur, subsequent encounter for closed fracture with nonunion: Secondary | ICD-10-CM | POA: Diagnosis not present

## 2023-01-03 DIAGNOSIS — G8928 Other chronic postprocedural pain: Secondary | ICD-10-CM | POA: Diagnosis not present

## 2023-01-03 DIAGNOSIS — S32811D Multiple fractures of pelvis with unstable disruption of pelvic ring, subsequent encounter for fracture with routine healing: Secondary | ICD-10-CM | POA: Diagnosis not present

## 2023-01-03 DIAGNOSIS — Z79891 Long term (current) use of opiate analgesic: Secondary | ICD-10-CM | POA: Diagnosis not present

## 2023-01-03 DIAGNOSIS — M545 Low back pain, unspecified: Secondary | ICD-10-CM | POA: Diagnosis not present

## 2023-01-03 DIAGNOSIS — Z5181 Encounter for therapeutic drug level monitoring: Secondary | ICD-10-CM | POA: Diagnosis not present

## 2023-01-29 DIAGNOSIS — Z1389 Encounter for screening for other disorder: Secondary | ICD-10-CM | POA: Diagnosis not present

## 2023-01-29 DIAGNOSIS — Z1331 Encounter for screening for depression: Secondary | ICD-10-CM | POA: Diagnosis not present

## 2023-01-29 DIAGNOSIS — K219 Gastro-esophageal reflux disease without esophagitis: Secondary | ICD-10-CM | POA: Diagnosis not present

## 2023-01-29 DIAGNOSIS — M25551 Pain in right hip: Secondary | ICD-10-CM | POA: Diagnosis not present

## 2023-03-19 DIAGNOSIS — N760 Acute vaginitis: Secondary | ICD-10-CM | POA: Diagnosis not present

## 2023-03-19 DIAGNOSIS — Z1389 Encounter for screening for other disorder: Secondary | ICD-10-CM | POA: Diagnosis not present

## 2023-03-19 DIAGNOSIS — D509 Iron deficiency anemia, unspecified: Secondary | ICD-10-CM | POA: Diagnosis not present

## 2023-03-19 DIAGNOSIS — R1084 Generalized abdominal pain: Secondary | ICD-10-CM | POA: Diagnosis not present

## 2023-04-03 DIAGNOSIS — G8928 Other chronic postprocedural pain: Secondary | ICD-10-CM | POA: Diagnosis not present

## 2023-04-03 DIAGNOSIS — S72351K Displaced comminuted fracture of shaft of right femur, subsequent encounter for closed fracture with nonunion: Secondary | ICD-10-CM | POA: Diagnosis not present

## 2023-04-03 DIAGNOSIS — S32811D Multiple fractures of pelvis with unstable disruption of pelvic ring, subsequent encounter for fracture with routine healing: Secondary | ICD-10-CM | POA: Diagnosis not present

## 2023-04-03 DIAGNOSIS — Z5181 Encounter for therapeutic drug level monitoring: Secondary | ICD-10-CM | POA: Diagnosis not present

## 2023-04-03 DIAGNOSIS — X58XXXD Exposure to other specified factors, subsequent encounter: Secondary | ICD-10-CM | POA: Diagnosis not present

## 2023-04-03 DIAGNOSIS — Z79891 Long term (current) use of opiate analgesic: Secondary | ICD-10-CM | POA: Diagnosis not present

## 2023-04-03 DIAGNOSIS — G894 Chronic pain syndrome: Secondary | ICD-10-CM | POA: Diagnosis not present

## 2023-04-03 DIAGNOSIS — S72321K Displaced transverse fracture of shaft of right femur, subsequent encounter for closed fracture with nonunion: Secondary | ICD-10-CM | POA: Diagnosis not present

## 2023-06-17 DIAGNOSIS — R6881 Early satiety: Secondary | ICD-10-CM | POA: Diagnosis not present

## 2023-06-17 DIAGNOSIS — K219 Gastro-esophageal reflux disease without esophagitis: Secondary | ICD-10-CM | POA: Diagnosis not present

## 2023-06-17 DIAGNOSIS — K5909 Other constipation: Secondary | ICD-10-CM | POA: Diagnosis not present

## 2023-06-17 DIAGNOSIS — R110 Nausea: Secondary | ICD-10-CM | POA: Diagnosis not present

## 2023-07-04 DIAGNOSIS — S72321K Displaced transverse fracture of shaft of right femur, subsequent encounter for closed fracture with nonunion: Secondary | ICD-10-CM | POA: Diagnosis not present

## 2023-07-04 DIAGNOSIS — Z5181 Encounter for therapeutic drug level monitoring: Secondary | ICD-10-CM | POA: Diagnosis not present

## 2023-07-04 DIAGNOSIS — S72351K Displaced comminuted fracture of shaft of right femur, subsequent encounter for closed fracture with nonunion: Secondary | ICD-10-CM | POA: Diagnosis not present

## 2023-07-04 DIAGNOSIS — S32811D Multiple fractures of pelvis with unstable disruption of pelvic ring, subsequent encounter for fracture with routine healing: Secondary | ICD-10-CM | POA: Diagnosis not present

## 2023-07-04 DIAGNOSIS — G894 Chronic pain syndrome: Secondary | ICD-10-CM | POA: Diagnosis not present

## 2023-07-04 DIAGNOSIS — Z79891 Long term (current) use of opiate analgesic: Secondary | ICD-10-CM | POA: Diagnosis not present

## 2023-07-04 DIAGNOSIS — G8928 Other chronic postprocedural pain: Secondary | ICD-10-CM | POA: Diagnosis not present

## 2023-07-14 DIAGNOSIS — Z1389 Encounter for screening for other disorder: Secondary | ICD-10-CM | POA: Diagnosis not present

## 2023-07-14 DIAGNOSIS — M109 Gout, unspecified: Secondary | ICD-10-CM | POA: Diagnosis not present

## 2023-07-14 DIAGNOSIS — K219 Gastro-esophageal reflux disease without esophagitis: Secondary | ICD-10-CM | POA: Diagnosis not present

## 2023-07-14 DIAGNOSIS — Z1331 Encounter for screening for depression: Secondary | ICD-10-CM | POA: Diagnosis not present

## 2023-07-14 DIAGNOSIS — R0609 Other forms of dyspnea: Secondary | ICD-10-CM | POA: Diagnosis not present

## 2023-07-15 DIAGNOSIS — R0609 Other forms of dyspnea: Secondary | ICD-10-CM | POA: Diagnosis not present

## 2023-07-21 ENCOUNTER — Encounter: Payer: Self-pay | Admitting: *Deleted

## 2023-07-22 ENCOUNTER — Ambulatory Visit: Payer: 59 | Admitting: Certified Registered"

## 2023-07-22 ENCOUNTER — Ambulatory Visit
Admission: RE | Admit: 2023-07-22 | Discharge: 2023-07-22 | Disposition: A | Discharge status: Home / Self Care | Payer: 59 | Attending: Gastroenterology | Admitting: Gastroenterology

## 2023-07-22 ENCOUNTER — Encounter
Admission: RE | Disposition: A | Discharge status: Home / Self Care | Payer: Self-pay | Source: Home / Self Care | Attending: Gastroenterology

## 2023-07-22 ENCOUNTER — Encounter: Payer: Self-pay | Admitting: *Deleted

## 2023-07-22 DIAGNOSIS — K2960 Other gastritis without bleeding: Secondary | ICD-10-CM | POA: Diagnosis not present

## 2023-07-22 DIAGNOSIS — G8929 Other chronic pain: Secondary | ICD-10-CM | POA: Insufficient documentation

## 2023-07-22 DIAGNOSIS — R6881 Early satiety: Secondary | ICD-10-CM | POA: Insufficient documentation

## 2023-07-22 DIAGNOSIS — Z853 Personal history of malignant neoplasm of breast: Secondary | ICD-10-CM | POA: Diagnosis not present

## 2023-07-22 DIAGNOSIS — R140 Abdominal distension (gaseous): Secondary | ICD-10-CM | POA: Diagnosis not present

## 2023-07-22 DIAGNOSIS — Z6839 Body mass index (BMI) 39.0-39.9, adult: Secondary | ICD-10-CM | POA: Insufficient documentation

## 2023-07-22 DIAGNOSIS — K219 Gastro-esophageal reflux disease without esophagitis: Secondary | ICD-10-CM | POA: Insufficient documentation

## 2023-07-22 DIAGNOSIS — K2970 Gastritis, unspecified, without bleeding: Secondary | ICD-10-CM | POA: Diagnosis not present

## 2023-07-22 DIAGNOSIS — R1084 Generalized abdominal pain: Secondary | ICD-10-CM | POA: Insufficient documentation

## 2023-07-22 HISTORY — PX: BIOPSY: SHX5522

## 2023-07-22 HISTORY — PX: ESOPHAGOGASTRODUODENOSCOPY (EGD) WITH PROPOFOL: SHX5813

## 2023-07-22 LAB — POCT PREGNANCY, URINE: Preg Test, Ur: NEGATIVE

## 2023-07-22 SURGERY — ESOPHAGOGASTRODUODENOSCOPY (EGD) WITH PROPOFOL
Anesthesia: General

## 2023-07-22 MED ORDER — SODIUM CHLORIDE 0.9 % IV SOLN: INTRAVENOUS | Status: DC

## 2023-07-22 MED ORDER — PROPOFOL 10 MG/ML IV BOLUS
INTRAVENOUS | Status: DC | PRN
  Administered 2023-07-22 (×3): 50 mg via INTRAVENOUS

## 2023-07-22 MED ORDER — LIDOCAINE HCL (CARDIAC) PF 100 MG/5ML IV SOSY
PREFILLED_SYRINGE | INTRAVENOUS | Status: DC | PRN
  Administered 2023-07-22 (×3): 100 mg via INTRAVENOUS

## 2023-07-22 NOTE — Op Note (Signed)
[facility] Gastroenterology Patient Name: Kim Barker Procedure Date: 07/22/2023 9:53 AM MRN: [id number] Account #: [id number] Date of Birth: September 11, 1981 Admit Type: Outpatient Age: 42 Room: [facility] ENDO ROOM 1 Gender: Female Note Status: Finalized Instrument Name: Upper Endoscope [id number] Procedure:             Upper GI endoscopy Indications:           Generalized abdominal pain, Abdominal bloating, Early                         satiety Providers:             Eather Colas MD, MD Referring MD:          Jeanmarie Plant, MD (Referring MD) Medicines:             Monitored Anesthesia Care Complications:         No immediate complications. Estimated blood loss:                         Minimal. Procedure:             Pre-Anesthesia Assessment:                        - Prior to the procedure, a History and Physical was                         performed, and patient medications and allergies were                         reviewed. The patient is competent. The risks and                         benefits of the procedure and the sedation options and                         risks were discussed with the patient. All questions                         were answered and informed consent was obtained.                         Patient identification and proposed procedure were                         verified by the physician, the nurse, the                         anesthesiologist, the anesthetist and the technician                         in the endoscopy suite. Mental Status Examination:                         alert and oriented. Airway Examination: normal                         oropharyngeal airway and neck mobility. Respiratory  Examination: clear to auscultation. CV Examination:                         normal. Prophylactic Antibiotics: The patient does not                         require prophylactic antibiotics. Prior                          Anticoagulants: The patient has taken no anticoagulant                         or antiplatelet agents. ASA Grade Assessment: III - A                         patient with severe systemic disease. After reviewing                         the risks and benefits, the patient was deemed in                         satisfactory condition to undergo the procedure. The                         anesthesia plan was to use monitored anesthesia care                         (MAC). Immediately prior to administration of                         medications, the patient was re-assessed for adequacy                         to receive sedatives. The heart rate, respiratory                         rate, oxygen saturations, blood pressure, adequacy of                         pulmonary ventilation, and response to care were                         monitored throughout the procedure. The physical                         status of the patient was re-assessed after the                         procedure.                        After obtaining informed consent, the endoscope was                         passed under direct vision. Throughout the procedure,                         the patient's blood pressure, pulse, and oxygen  saturations were monitored continuously. The Endoscope                         was introduced through the mouth, and advanced to the                         second part of duodenum. The upper GI endoscopy was                         accomplished without difficulty. The patient tolerated                         the procedure well. Findings:      The examined esophagus was normal.      Patchy mild inflammation characterized by erythema was found in the       gastric antrum. Biopsies were taken with a cold forceps for Helicobacter       pylori testing. Estimated blood loss was minimal.      The examined duodenum was normal. Impression:            - Normal esophagus.                         - Gastritis. Biopsied.                        - Normal examined duodenum. Recommendation:        - Discharge patient to home.                        - Resume previous diet.                        - Continue present medications.                        - Await pathology results.                        - Return to referring physician as previously                         scheduled. Procedure Code(s):     --- Professional ---                        [phone], Esophagogastroduodenoscopy, flexible,                         transoral; with biopsy, single or multiple Diagnosis Code(s):     --- Professional ---                        K29.70, Gastritis, unspecified, without bleeding                        R10.84, Generalized abdominal pain                        R14.0, Abdominal distension (gaseous)                        R68.81, Early satiety CPT copyright 2022 American Medical  Association. All rights reserved. The codes documented in this report are preliminary and upon coder review may  be revised to meet current compliance requirements. Eather Colas MD, MD 07/22/2023 10:16:07 AM Number of Addenda: 0 Note Initiated On: 07/22/2023 9:53 AM Estimated Blood Loss:  Estimated blood loss was minimal.      [facility]

## 2023-07-22 NOTE — Transfer of Care (Signed)
Immediate Anesthesia Transfer of Care Note  Patient: [name]  Procedure(s) Performed: ESOPHAGOGASTRODUODENOSCOPY (EGD) WITH PROPOFOL BIOPSY  Patient Location: PACU  Anesthesia Type:General  Level of Consciousness: drowsy and patient cooperative  Airway & Oxygen Therapy: Patient Spontanous Breathing  Post-op Assessment: Report given to RN and Post -op Vital signs reviewed and stable  Post vital signs: stable  Last Vitals:  Vitals Value Taken Time  BP 116/81 07/22/23 1015  Temp    Pulse 97 07/22/23 1016  Resp 20 07/22/23 1016  SpO2 96 % 07/22/23 1016  Vitals shown include unfiled device data.  Last Pain:  Vitals:   07/22/23 0939  TempSrc: Temporal  PainSc: 0-No pain         Complications: No notable events documented.

## 2023-07-22 NOTE — Anesthesia Preprocedure Evaluation (Signed)
Anesthesia Evaluation  Patient identified by MRN, date of birth, ID band Patient awake    Reviewed: Allergy & Precautions, H&P , NPO status , Patient's Chart, lab work & pertinent test results, reviewed documented beta blocker date and time   History of Anesthesia Complications Negative for: history of anesthetic complications  Airway Mallampati: I  TM Distance: >3 FB Neck ROM: full    Dental  (+) Dental Advidsory Given, Chipped, Teeth Intact   Pulmonary neg pulmonary ROS, Continuous Positive Airway Pressure Ventilation    Pulmonary exam normal breath sounds clear to auscultation       Cardiovascular Exercise Tolerance: Good negative cardio ROS Normal cardiovascular exam Rhythm:regular Rate:Normal     Neuro/Psych negative neurological ROS  negative psych ROS   GI/Hepatic Neg liver ROS,GERD  ,,  Endo/Other  neg diabetes  Morbid obesity  Renal/GU negative Renal ROS  negative genitourinary   Musculoskeletal   Abdominal   Peds  Hematology negative hematology ROS (+)   Anesthesia Other Findings Past Medical History: No date: Allergy 2018: Breast cancer, left (HCC)     Comment:  Pt had partial mastectomy 02/20/2017. 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[facility] records No date: GERD (gastroesophageal reflux disease)     Comment:  PT WAS ON ACID REDUCER BUT HAS NOT TAKEN IN MONTHS No date: Headache     Comment:  MIGRAINES   Reproductive/Obstetrics negative OB ROS                             Anesthesia Physical Anesthesia Plan  ASA: 3  Anesthesia Plan: General   Post-op Pain Management:    Induction: Intravenous  PONV Risk Score and Plan: 3 and Propofol infusion and TIVA  Airway Management Planned:  Natural Airway and Nasal Cannula  Additional Equipment:   Intra-op Plan:   Post-operative Plan:   Informed Consent: I have reviewed the patients History and Physical, chart, labs and discussed the procedure including the risks, benefits and alternatives for the proposed anesthesia with the patient or authorized representative who has indicated his/her understanding and acceptance.     Dental Advisory Given  Plan Discussed with: Anesthesiologist, CRNA and Surgeon  Anesthesia Plan Comments:        Anesthesia Quick Evaluation

## 2023-07-22 NOTE — Interval H&P Note (Signed)
History and Physical Interval Note:  07/22/2023 9:58 AM  Kim Barker  has presented today for surgery, with the diagnosis of Nausea GERD.  The various methods of treatment have been discussed with the patient and family. After consideration of risks, benefits and other options for treatment, the patient has consented to  Procedure(s): ESOPHAGOGASTRODUODENOSCOPY (EGD) WITH PROPOFOL (N/A) as a surgical intervention.  The patient's history has been reviewed, patient examined, no change in status, stable for surgery.  I have reviewed the patient's chart and labs.  Questions were answered to the patient's satisfaction.     [name]  Ok to proceed with EGD

## 2023-07-22 NOTE — H&P (Signed)
Outpatient short stay form Pre-procedure 07/22/2023  [name], MD  Primary Physician: Gorden Harms, PA-C  Reason for visit:  Abdominal pain/ Bloating  History of present illness:    42 y/o lady with history of chronic pain on opioids, obesity, and headaches here for EGD for abdominal pain and bloating. No blood thinners. No family history of GI malignancies. No abdominal surgeries.    Current Facility-Administered Medications:    0.9 %  sodium chloride infusion, , Intravenous, Continuous, Thaddaeus Granja, Rossie Muskrat, MD  Medications Prior to Admission  Medication Sig Dispense Refill Last Dose   ferrous sulfate 220 (44 Fe) MG/5ML solution Take 220 mg by mouth daily.      gabapentin (NEURONTIN) 600 MG tablet Take 600 mg by mouth 3 (three) times daily.   07/21/2023   metoCLOPramide (REGLAN) 5 MG tablet Take 5 mg by mouth 4 (four) times daily.      diphenhydramine-acetaminophen (TYLENOL PM) 25-500 MG TABS tablet Take by mouth.      HYDROcodone-acetaminophen (NORCO) 5-325 MG tablet Take 1-2 tablets by mouth every 4 (four) hours as needed for moderate pain. 30 tablet 0    ibuprofen (ADVIL,MOTRIN) 800 MG tablet Take 800 mg by mouth.      Melatonin 5 MG CAPS Take by mouth. 1 at hs for sleep      oxyCODONE-acetaminophen (PERCOCET) 5-325 MG tablet Take 1 tablet by mouth every 8 (eight) hours as needed. 6 tablet 0      No Known Allergies   Past Medical History:  Diagnosis Date   Allergy    Breast cancer, left (HCC) 2018   Pt had partial mastectomy 02/20/2017. 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    Physical Exam  Gen: Alert, oriented. Appears stated age.   HEENT: PERRLA. Lungs: No respiratory distress CV: RRR Abd: soft, benign, no masses Ext: No edema    Planned procedures: Proceed with EGD. The patient understands the nature of the planned procedure, indications, risks, alternatives and potential complications including but not limited to bleeding, infection, perforation, damage to internal organs and possible oversedation/side effects from anesthesia. The patient agrees and gives consent to proceed.  Please refer to procedure notes for findings, recommendations and patient disposition/instructions.     [name], MD [facility] Gastroenterology

## 2023-07-23 ENCOUNTER — Encounter: Payer: Self-pay | Admitting: Gastroenterology

## 2023-07-23 NOTE — Anesthesia Postprocedure Evaluation (Signed)
Anesthesia Post Note  Patient: Radio producer  Procedure(s) Performed: ESOPHAGOGASTRODUODENOSCOPY (EGD) WITH PROPOFOL BIOPSY  Patient location during evaluation: Endoscopy Anesthesia Type: General Level of consciousness: awake and alert Pain management: pain level controlled Vital Signs Assessment: post-procedure vital signs reviewed and stable Respiratory status: spontaneous breathing, nonlabored ventilation, respiratory function stable and patient connected to nasal cannula oxygen Cardiovascular status: blood pressure returned to baseline and stable Postop Assessment: no apparent nausea or vomiting Anesthetic complications: no   There were no known notable events for this encounter.   Last Vitals:  Vitals:   07/22/23 1015 07/22/23 1025  BP: 116/81 (!) 127/96  Pulse: 91   Resp: 17   Temp: (!) 36.3 C   SpO2: 96%     Last Pain:  Vitals:   07/23/23 0715  TempSrc:   PainSc: 0-No pain                 Lenard Simmer

## 2023-08-25 DIAGNOSIS — Z6838 Body mass index (BMI) 38.0-38.9, adult: Secondary | ICD-10-CM | POA: Diagnosis not present

## 2023-08-25 DIAGNOSIS — J029 Acute pharyngitis, unspecified: Secondary | ICD-10-CM | POA: Diagnosis not present

## 2023-08-25 DIAGNOSIS — J00 Acute nasopharyngitis [common cold]: Secondary | ICD-10-CM | POA: Diagnosis not present

## 2023-10-15 DIAGNOSIS — G8928 Other chronic postprocedural pain: Secondary | ICD-10-CM | POA: Diagnosis not present

## 2023-10-15 DIAGNOSIS — G894 Chronic pain syndrome: Secondary | ICD-10-CM | POA: Diagnosis not present

## 2023-10-15 DIAGNOSIS — Z5181 Encounter for therapeutic drug level monitoring: Secondary | ICD-10-CM | POA: Diagnosis not present

## 2023-10-15 DIAGNOSIS — S72351K Displaced comminuted fracture of shaft of right femur, subsequent encounter for closed fracture with nonunion: Secondary | ICD-10-CM | POA: Diagnosis not present

## 2023-10-15 DIAGNOSIS — Z79891 Long term (current) use of opiate analgesic: Secondary | ICD-10-CM | POA: Diagnosis not present

## 2023-10-15 DIAGNOSIS — S32811D Multiple fractures of pelvis with unstable disruption of pelvic ring, subsequent encounter for fracture with routine healing: Secondary | ICD-10-CM | POA: Diagnosis not present

## 2023-10-15 DIAGNOSIS — S72321K Displaced transverse fracture of shaft of right femur, subsequent encounter for closed fracture with nonunion: Secondary | ICD-10-CM | POA: Diagnosis not present

## 2023-10-15 DIAGNOSIS — M47816 Spondylosis without myelopathy or radiculopathy, lumbar region: Secondary | ICD-10-CM | POA: Diagnosis not present

## 2024-07-14 ENCOUNTER — Encounter: Payer: Self-pay | Admitting: Emergency Medicine

## 2024-07-14 ENCOUNTER — Other Ambulatory Visit: Payer: Self-pay

## 2024-07-14 ENCOUNTER — Emergency Department: Admission: EM | Admit: 2024-07-14 | Discharge: 2024-07-14 | Disposition: A | Discharge status: Home / Self Care

## 2024-07-14 DIAGNOSIS — J069 Acute upper respiratory infection, unspecified: Secondary | ICD-10-CM | POA: Insufficient documentation

## 2024-07-14 DIAGNOSIS — H9201 Otalgia, right ear: Secondary | ICD-10-CM | POA: Diagnosis present

## 2024-07-14 MED ORDER — FLUCONAZOLE 150 MG PO TABS: 150.0000 mg | ORAL_TABLET | ORAL | 0 refills | Status: DC

## 2024-07-14 MED ORDER — AMOXICILLIN 500 MG PO TABS
500.0000 mg | ORAL_TABLET | Freq: Three times a day (TID) | ORAL | 0 refills | Status: AC
Start: 2024-07-14 — End: 2024-07-21

## 2024-07-14 MED ORDER — FLUCONAZOLE 150 MG PO TABS
150.0000 mg | ORAL_TABLET | ORAL | 0 refills | Status: AC
Start: 2024-07-14 — End: ?

## 2024-07-14 NOTE — ED Triage Notes (Signed)
 Patient to ED via POV for right ear pain since Saturday. States pain in head as well. NAD noted.

## 2024-07-14 NOTE — ED Provider Notes (Signed)
   [facility] Provider Note    Event Date/Time   First MD Initiated Contact with Patient 07/14/24 1610     (approximate)   History   Otalgia   HPI  Kim Barker is a 43 y.o. female with history of GERD with history of  and as listed in EMR presents to the emergency department for treatment and evaluation of headache and right earache for the past 3 to 4 days.  No known fever..     Physical Exam    Vitals:   07/14/24 1441  BP: (!) 153/99  Pulse: 98  Resp: 18  Temp: 98.5 F (36.9 C)  SpO2: 100%    General: Awake, no distress.  CV:  Good peripheral perfusion.  Resp:  Normal effort.  Abd:  No distention.  Other:  Frontal sinus tenderness on the right.  Right TM injected.   ED Results / Procedures / Treatments   Labs (all labs ordered are listed, but only abnormal results are displayed)  Labs Reviewed - No data to display   EKG  Not indicated   RADIOLOGY  Image and radiology report reviewed and interpreted by me. Radiology report consistent with the same.  Not indicated  PROCEDURES:  Critical Care performed: No  Procedures   MEDICATIONS ORDERED IN ED:  Medications - No data to display   IMPRESSION / MDM / ASSESSMENT AND PLAN / ED COURSE   I have reviewed the triage note and vital signs. Vital signs stable   Differential diagnosis includes, but is not limited to, COVID, influenza, URI  Patient's presentation is most consistent with acute illness / injury with system symptoms.  43 year old female presenting to the emergency department for treatment and evaluation of right ear pain and headache.  See HPI for further details.  On exam, she has tenderness over the frontal sinus on the right.  TM is injected.  She reports that the symptoms are getting worse instead of better.  She will be treated with amoxicillin  and encouraged to use Tylenol  or ibuprofen. Discharged in stable condition.      FINAL CLINICAL  IMPRESSION(S) / ED DIAGNOSES   Final diagnoses:  Upper respiratory infection, acute     Rx / DC Orders   ED Discharge Orders          Ordered    amoxicillin  (AMOXIL ) 500 MG tablet  3 times daily        07/14/24 1626    fluconazole  (DIFLUCAN ) 150 MG tablet  Weekly,   Status:  Discontinued        07/14/24 1640    fluconazole  (DIFLUCAN ) 150 MG tablet  Weekly        07/14/24 1732             Note:  This document was prepared using Dragon voice recognition software and may include unintentional dictation errors.   Herlinda Kirk NOVAK, FNP 07/14/24 2324    Clarine Ozell LABOR, MD 07/16/24 [phone]

## 2024-07-27 ENCOUNTER — Encounter: Payer: Self-pay | Admitting: Emergency Medicine

## 2024-07-27 ENCOUNTER — Other Ambulatory Visit: Payer: Self-pay

## 2024-07-27 ENCOUNTER — Emergency Department

## 2024-07-27 ENCOUNTER — Emergency Department
Admission: EM | Admit: 2024-07-27 | Discharge: 2024-07-27 | Disposition: A | Discharge status: Home / Self Care | Attending: Emergency Medicine | Admitting: Emergency Medicine

## 2024-07-27 DIAGNOSIS — J0130 Acute sphenoidal sinusitis, unspecified: Secondary | ICD-10-CM | POA: Diagnosis not present

## 2024-07-27 DIAGNOSIS — R519 Headache, unspecified: Secondary | ICD-10-CM

## 2024-07-27 MED ORDER — FLUTICASONE PROPIONATE 50 MCG/ACT NA SUSP: 1.0000 | Freq: Every day | NASAL | 0 refills | Status: AC

## 2024-07-27 MED ORDER — PSEUDOEPHEDRINE HCL 60 MG PO TABS: 60.0000 mg | ORAL_TABLET | Freq: Four times a day (QID) | ORAL | 0 refills | Status: AC | PRN

## 2024-07-27 NOTE — ED Provider Notes (Signed)
 [facility] Provider Note    Event Date/Time   First MD Initiated Contact with Patient 07/27/24 [phone]     (approximate)   History   Headache   HPI  Kim Barker is a 43 y.o. female with a history of GERD and normocytic anemia who presents with headache, persistent course for approximately last 2 weeks, intermittent lasting for several minutes at a time, and described as a shooting pain on the right side of her head and behind her right ear.  She also reports some congestion in the right ear over the last several days although no hearing loss or ear pain.  She denies any photophobia or vision changes.  She has no nausea or vomiting.  She states that she occasionally feels lightheaded with a headache.  She denies any prior history of this headache.  She states that she took the amoxicillin  that was prescribed here, without relief.  She subsequent went to urgent care 6 days ago and was changed to Augmentin but continues to have no significant relief to her symptoms.  I reviewed the past medical records.  The patient's most recent prior encounter was on 8/20 here in the ED for headache and right earache.  She was diagnosed with possible sinusitis and started on amoxicillin  and Diflucan .   Physical Exam   Triage Vital Signs: ED Triage Vitals  Encounter Vitals Group     BP 07/27/24 0937 (!) 160/100     Girls Systolic BP Percentile --      Girls Diastolic BP Percentile --      Boys Systolic BP Percentile --      Boys Diastolic BP Percentile --      Pulse Rate 07/27/24 0937 94     Resp 07/27/24 0937 18     Temp 07/27/24 0937 98.8 F (37.1 C)     Temp Source 07/27/24 0937 Oral     SpO2 07/27/24 0937 100 %     Weight 07/27/24 0936 229 lb (103.9 kg)     Height 07/27/24 0936 5' 4 (1.626 m)     Head Circumference --      Peak Flow --      Pain Score 07/27/24 0935 7     Pain Loc --      Pain Education --      Exclude from Growth Chart --     Most recent  vital signs: Vitals:   07/27/24 0937  BP: (!) 160/100  Pulse: 94  Resp: 18  Temp: 98.8 F (37.1 C)  SpO2: 100%     General: Awake, no distress.  CV:  Good peripheral perfusion.  Resp:  Normal effort.  Abd:  No distention.  Other:  EOMI.  PERRLA.  No photophobia.  No facial droop.  Normal speech.  Motor intact in all extremities.  No ataxia.  Oropharynx clear.  Right ear canal and TM appears normal.  Very mild right mastoid area tenderness.   ED Results / Procedures / Treatments   Labs (all labs ordered are listed, but only abnormal results are displayed) Labs Reviewed - No data to display   EKG     RADIOLOGY  CT head: I independently viewed and interpreted the images; there is no ICH.  Radiology report indicates mucosal thickening in the sphenoid sinuses but no other acute abnormalities.  PROCEDURES:  Critical Care performed: No  Procedures   MEDICATIONS ORDERED IN ED: Medications - No data to display   IMPRESSION / MDM /  ASSESSMENT AND PLAN / ED COURSE  I reviewed the triage vital signs and the nursing notes.  43 year old female presents with intermittent right-sided headache for the last [redacted] weeks along with congestion in her right ear.  She was diagnosed with possible sinusitis and started on antibiotics but has not had any relief.  Neuro exam is normal.  Differential diagnosis includes, but is not limited to, sinusitis, Titus media, tension headache, migraine, trigeminal neuralgia, less likely mastoiditis or other infectious process.  Given the persistent headaches for close to 2 weeks we will obtain a CT for further evaluation.  Patient's presentation is most consistent with acute complicated illness / injury requiring diagnostic workup.  ----------------------------------------- 12:23 PM on 07/27/2024 -----------------------------------------  CT is negative for acute findings except for some mucosal thickening in the sphenoid sinuses.  I recommended  giving initial treatment for possible sinusitis including a decongestant and steroid.  She can finish out the course of antibiotics.  The patient is already on gabapentin .  I hesitate to start carbamazepine or other specific treatment for possible trigeminal neuralgia since this seems less consistent with the patient's presentation.  I recommended that she follow-up with neurology if she has continued symptoms after the sinusitis treatment.  I also gave strict return precautions to the ED, and she expressed understanding.  She is stable for discharge at this time.   FINAL CLINICAL IMPRESSION(S) / ED DIAGNOSES   Final diagnoses:  Nonintractable episodic headache, unspecified headache type  Acute sphenoidal sinusitis, recurrence not specified     Rx / DC Orders   ED Discharge Orders          Ordered    pseudoephedrine  (SUDAFED) 60 MG tablet  Every 6 hours PRN        07/27/24 1221    fluticasone  (FLONASE ) 50 MCG/ACT nasal spray  Daily        07/27/24 1221             Note:  This document was prepared using Dragon voice recognition software and may include unintentional dictation errors.    Jacolyn Pae, MD 07/27/24 1224

## 2024-07-27 NOTE — Discharge Instructions (Addendum)
 You can finish the course of the antibiotics.  Use the Flonase  and pseudoephedrine  as prescribed to help decrease congestion in your sinuses and your ear.  Your headache is likely caused by the fluid and inflammation in your sinuses, but this could also be caused by something called trigeminal neuralgia which is a nerve pain.  If it does not get better over the next few days, you should make an appointment to follow-up with the neurologist.  In the meantime, return to the emergency department immediately for new, worsening, or persistent severe headache, dizziness, neck stiffness, fever, hearing loss, difficulty swallowing, or any other new or worsening symptoms that concern you.

## 2024-07-27 NOTE — ED Triage Notes (Signed)
 Patient to ED via POV for head pain- on the back of head. States she was seen for same on 8/20 and at [facility] on Wednesday. Not felling better.
